# Patient Record
Sex: Male | Born: 1980 | Race: Black or African American | Hispanic: No | Marital: Married | Smoking: Never smoker
Health system: Southern US, Community
[De-identification: ages and names within clinical notes are randomized; demographics above are authoritative.]

## PROBLEM LIST (undated history)

## (undated) DIAGNOSIS — R030 Elevated blood-pressure reading, without diagnosis of hypertension: Secondary | ICD-10-CM

## (undated) DIAGNOSIS — G43909 Migraine, unspecified, not intractable, without status migrainosus: Secondary | ICD-10-CM

## (undated) HISTORY — DX: Migraine, unspecified, not intractable, without status migrainosus: G43.909

## (undated) HISTORY — DX: Elevated blood-pressure reading, without diagnosis of hypertension: R03.0

---

## 2011-04-08 ENCOUNTER — Emergency Department (HOSPITAL_COMMUNITY)
Admission: EM | Admit: 2011-04-08 | Discharge: 2011-04-08 | Disposition: A | Discharge status: Home / Self Care | Payer: Managed Care, Other (non HMO) | Source: Home / Self Care | Attending: Family Medicine | Admitting: Family Medicine

## 2011-04-08 ENCOUNTER — Encounter (HOSPITAL_COMMUNITY): Payer: Self-pay | Admitting: Emergency Medicine

## 2011-04-08 DIAGNOSIS — J029 Acute pharyngitis, unspecified: Secondary | ICD-10-CM

## 2011-04-08 MED ORDER — AMOXICILLIN 500 MG PO CAPS: 500.0000 mg | ORAL_CAPSULE | Freq: Three times a day (TID) | ORAL | Status: AC

## 2011-04-08 NOTE — ED Provider Notes (Signed)
History     CSN: [id number]  Arrival date & time 04/08/11  0808   First MD Initiated Contact with Patient 04/08/11 [phone]      Chief Complaint  Patient presents with  . Sore Throat    (Consider location/radiation/quality/duration/timing/severity/associated sxs/prior treatment) HPI Comments: Jon English presents for evaluation of sore throat since Saturday, chills, myalgias, and low back pain. He denies any cough, sneezing, or rhinorrhea.  Patient is a 31 y.o. male presenting with pharyngitis. The history is provided by the patient.  Sore Throat This is a new problem. The problem occurs constantly. The problem has not changed since onset.The symptoms are aggravated by swallowing. The symptoms are relieved by nothing. He has tried acetaminophen for the symptoms.    History reviewed. No pertinent past medical history.  History reviewed. No pertinent past surgical history.  No family history on file.  History  Substance Use Topics  . Smoking status: Never Smoker   . Smokeless tobacco: Not on file  . Alcohol Use: No      Review of Systems  Constitutional: Negative.   HENT: Positive for sore throat.   Eyes: Negative.   Respiratory: Negative.   Cardiovascular: Negative.   Gastrointestinal: Negative.   Genitourinary: Negative.   Musculoskeletal: Negative.   Skin: Negative.   Neurological: Negative.     Allergies  Review of patient's allergies indicates no known allergies.  Home Medications   Current Outpatient Rx  Name Route Sig Dispense Refill  . AMOXICILLIN 500 MG PO CAPS Oral Take 1 capsule (500 mg total) by mouth 3 (three) times daily. 30 capsule 0    BP 150/85  Pulse 95  Temp(Src) 98.5 F (36.9 C) (Oral)  Resp 14  SpO2 100%  Physical Exam  Nursing note and vitals reviewed. Constitutional: He is oriented to person, place, and time. He appears well-developed and well-nourished.  HENT:  Head: Normocephalic and atraumatic.  Right Ear: Tympanic membrane  normal.  Left Ear: Tympanic membrane normal.  Mouth/Throat: Oropharyngeal exudate and posterior oropharyngeal erythema present.    Eyes: EOM are normal.  Neck: Normal range of motion.  Cardiovascular: Normal rate and regular rhythm.   Pulmonary/Chest: Effort normal and breath sounds normal. He has no decreased breath sounds. He has no wheezes. He has no rhonchi.  Musculoskeletal: Normal range of motion.  Neurological: He is alert and oriented to person, place, and time.  Skin: Skin is warm and dry.  Psychiatric: His behavior is normal.    ED Course  Procedures (including critical care time)   Labs Reviewed  POCT RAPID STREP A ([facility] ONLY)   No results found.   1. Pharyngitis       MDM  2/4 Centor criteria; RST negative; likely viral; given delayed rx for amoxicillin; supportive care        Renaee Munda, MD 04/08/11 [phone]

## 2011-04-08 NOTE — ED Notes (Signed)
PT HERE WITH SORE THROAT RADIATING TO EARS,INCREASED SALIVA AND BODY ACHES THAT STARTED LAST Friday.CHILLS NOTED AND WARM BUT DENIES FEVERS.NO VOMITING OR NECK PAIN REPORTED.VSS

## 2011-04-08 NOTE — Discharge Instructions (Signed)
Your lab results was negative for strep throat. Both viruses and bacteria can cause your symptoms, and can appear very similarly. This is likely viral pharyngitis, given a negative strep screen, and 2/4 Centor criteria. Continue supportive care with ibuprofen (Motrin, Advil) and/or acetaminophen (Tylenol) for fever control and control of pain and inflammation. You may alternate these every 4 hours; for example, if you take acetaminophen at noon, then you can take ibuprofen at 4 pm, then acetaminophen again at 8 pm, etc. Also, encourage aggressive hydration. However, if you are not improving over the next 48 hours, then fill the antibiotic rx. Return to care should your symptoms not improve, or worsen in any way.

## 2014-02-22 ENCOUNTER — Encounter (HOSPITAL_COMMUNITY): Payer: Self-pay | Admitting: *Deleted

## 2014-02-22 ENCOUNTER — Emergency Department (HOSPITAL_COMMUNITY)
Admission: EM | Admit: 2014-02-22 | Discharge: 2014-02-23 | Disposition: A | Discharge status: Home / Self Care | Payer: 59 | Attending: Emergency Medicine | Admitting: Emergency Medicine

## 2014-02-22 ENCOUNTER — Emergency Department (HOSPITAL_COMMUNITY): Payer: 59

## 2014-02-22 DIAGNOSIS — W108XXA Fall (on) (from) other stairs and steps, initial encounter: Secondary | ICD-10-CM | POA: Insufficient documentation

## 2014-02-22 DIAGNOSIS — S0990XA Unspecified injury of head, initial encounter: Secondary | ICD-10-CM | POA: Insufficient documentation

## 2014-02-22 DIAGNOSIS — K029 Dental caries, unspecified: Secondary | ICD-10-CM | POA: Insufficient documentation

## 2014-02-22 DIAGNOSIS — Y998 Other external cause status: Secondary | ICD-10-CM | POA: Insufficient documentation

## 2014-02-22 DIAGNOSIS — Y9389 Activity, other specified: Secondary | ICD-10-CM | POA: Diagnosis not present

## 2014-02-22 DIAGNOSIS — W19XXXA Unspecified fall, initial encounter: Secondary | ICD-10-CM

## 2014-02-22 DIAGNOSIS — K047 Periapical abscess without sinus: Secondary | ICD-10-CM

## 2014-02-22 DIAGNOSIS — S4992XA Unspecified injury of left shoulder and upper arm, initial encounter: Secondary | ICD-10-CM | POA: Diagnosis present

## 2014-02-22 DIAGNOSIS — Y92009 Unspecified place in unspecified non-institutional (private) residence as the place of occurrence of the external cause: Secondary | ICD-10-CM | POA: Insufficient documentation

## 2014-02-22 DIAGNOSIS — Z79899 Other long term (current) drug therapy: Secondary | ICD-10-CM | POA: Diagnosis not present

## 2014-02-22 DIAGNOSIS — M25512 Pain in left shoulder: Secondary | ICD-10-CM

## 2014-02-22 DIAGNOSIS — S199XXA Unspecified injury of neck, initial encounter: Secondary | ICD-10-CM | POA: Diagnosis not present

## 2014-02-22 LAB — COMPREHENSIVE METABOLIC PANEL
ALT: 18 U/L (ref 0–53)
AST: 29 U/L (ref 0–37)
Albumin: 4.1 g/dL (ref 3.5–5.2)
Alkaline Phosphatase: 51 U/L (ref 39–117)
Anion gap: 5 (ref 5–15)
BUN: 11 mg/dL (ref 6–23)
CALCIUM: 9 mg/dL (ref 8.4–10.5)
CO2: 29 mmol/L (ref 19–32)
CREATININE: 1.66 mg/dL — AB (ref 0.50–1.35)
Chloride: 103 mmol/L (ref 96–112)
GFR calc Af Amer: 61 mL/min — ABNORMAL LOW (ref >90)
GFR, EST NON AFRICAN AMERICAN: 52 mL/min — AB (ref >90)
GLUCOSE: 155 mg/dL — AB (ref 70–99)
POTASSIUM: 3.6 mmol/L (ref 3.5–5.1)
SODIUM: 137 mmol/L (ref 135–145)
TOTAL PROTEIN: 6.9 g/dL (ref 6.0–8.3)
Total Bilirubin: 1.7 mg/dL — ABNORMAL HIGH (ref 0.3–1.2)

## 2014-02-22 LAB — CBC WITH DIFFERENTIAL/PLATELET
BASOS ABS: 0 10*3/uL (ref 0.0–0.1)
Basophils Relative: 0 % (ref 0–1)
EOS ABS: 0.1 10*3/uL (ref 0.0–0.7)
Eosinophils Relative: 1 % (ref 0–5)
HCT: 44.2 % (ref 39.0–52.0)
Hemoglobin: 14.9 g/dL (ref 13.0–17.0)
LYMPHS PCT: 25 % (ref 12–46)
Lymphs Abs: 1.9 10*3/uL (ref 0.7–4.0)
MCH: 30.4 pg (ref 26.0–34.0)
MCHC: 33.7 g/dL (ref 30.0–36.0)
MCV: 90.2 fL (ref 78.0–100.0)
Monocytes Absolute: 0.7 10*3/uL (ref 0.1–1.0)
Monocytes Relative: 9 % (ref 3–12)
Neutro Abs: 5.1 10*3/uL (ref 1.7–7.7)
Neutrophils Relative %: 65 % (ref 43–77)
PLATELETS: 197 10*3/uL (ref 150–400)
RBC: 4.9 MIL/uL (ref 4.22–5.81)
RDW: 13.2 % (ref 11.5–15.5)
WBC: 7.8 10*3/uL (ref 4.0–10.5)

## 2014-02-22 MED ORDER — SODIUM CHLORIDE 0.9 % IV SOLN
3.0000 g | Freq: Once | INTRAVENOUS | Status: AC
  Administered 2014-02-22: 3 g via INTRAVENOUS
  Filled 2014-02-22: qty 3

## 2014-02-22 MED ORDER — SODIUM CHLORIDE 0.9 % IV BOLUS (SEPSIS)
2000.0000 mL | Freq: Once | INTRAVENOUS | Status: AC
  Administered 2014-02-22: 2000 mL via INTRAVENOUS

## 2014-02-22 NOTE — ED Notes (Signed)
The pt has had a headache for 2 days and a toothache also.  No pain in those areas noiw.  He took a vicodin and fell down steps at home.  No loc he has body aches now and lac  Forehead.

## 2014-02-23 MED ORDER — PENICILLIN V POTASSIUM 500 MG PO TABS: 500.0000 mg | ORAL_TABLET | Freq: Three times a day (TID) | ORAL | Status: DC

## 2014-02-23 MED ORDER — HYDROCODONE-ACETAMINOPHEN 5-325 MG PO TABS: 1.0000 | ORAL_TABLET | ORAL | Status: DC | PRN

## 2014-02-23 NOTE — ED Provider Notes (Signed)
CSN: [id number]     Arrival date & time 02/22/14  2032 History   First MD Initiated Contact with Patient 02/22/14 2057     Chief Complaint  Patient presents with  . Fall     HPI Patient reports his had left lower dental pain over the past 2-3 days.  He had decreased oral intake and has not been eating much secondary to dental pain.  He has not seen a dentist about this.  He does admit to having dental decay at baseline.  He took Vicodin that he had at the house for pain and became lightheaded when he was standing at the top of the stairs.  He has syncopal episode resulting in him falling down the stairs.  He reports that he fell down the majority of the stairs.  He reports pain in his anterior left shoulder at this time.  He reports mild headache injury.  Reports mild neck pain.  He denies weakness of his arms or legs.  Denies chest pain or abdominal pain.  No back pain.  No pain in his legs.  His dental pain is mild to moderate in severity.  No difficulty breathing or swallowing.   History reviewed. No pertinent past medical history. History reviewed. No pertinent past surgical history. No family history on file. History  Substance Use Topics  . Smoking status: Never Smoker   . Smokeless tobacco: Not on file  . Alcohol Use: No    Review of Systems  All other systems reviewed and are negative.     Allergies  Review of patient's allergies indicates no known allergies.  Home Medications   Prior to Admission medications   Medication Sig Start Date End Date Taking? Authorizing Provider  Pseudoeph-Doxylamine-DM-APAP (NYQUIL PO) Take 15 mLs by mouth at bedtime as needed (cold symptoms).   Yes Historical Provider, MD  HYDROcodone-acetaminophen (NORCO/VICODIN) 5-325 MG per tablet Take 1 tablet by mouth every 4 (four) hours as needed for moderate pain. 02/23/14   Lyanne Co, MD  penicillin v potassium (VEETID) 500 MG tablet Take 1 tablet (500 mg total) by mouth 3 (three) times daily.  02/23/14   Lyanne Co, MD   BP 132/82 mmHg  Pulse 81  Temp(Src) 98.1 F (36.7 C) (Oral)  Resp 16  SpO2 100% Physical Exam  Constitutional: He is oriented to person, place, and time. He appears well-developed and well-nourished.  HENT:  Head: Normocephalic and atraumatic.  Dry mucous membranes.  Dental decay throughout.  Left lower second molar has evidence of dental decay and focal tenderness  Eyes: EOM are normal.  Neck: Neck supple.  C-spine immobilized in cervical collar.  Mild cervical paracervical tenderness without cervical step-offs.  Cardiovascular: Normal rate, regular rhythm, normal heart sounds and intact distal pulses.   Pulmonary/Chest: Effort normal and breath sounds normal. No respiratory distress.  Abdominal: Soft. He exhibits no distension. There is no tenderness.  Musculoskeletal: Normal range of motion.  Full range of motion bilateral hips, knees, ankles.  Pain with range of motion of the left shoulder. no obvious deformity of the left before meals joint.  Mild tenderness at the anterior left humeral head without deformity.  Normal left radial pulse..  Normal range of motion of his bilateral elbows and wrists.  Normal range of motion of his right shoulder.  No thoracic or lumbar tenderness.  Neurological: He is alert and oriented to person, place, and time.  Skin: Skin is warm and dry.  Psychiatric: He has a normal  mood and affect. Judgment normal.  Nursing note and vitals reviewed.   ED Course  Procedures (including critical care time) Labs Review Labs Reviewed  COMPREHENSIVE METABOLIC PANEL - Abnormal; Notable for the following:    Glucose, Bld 155 (*)    Creatinine, Ser 1.66 (*)    Total Bilirubin 1.7 (*)    GFR calc non Af Amer 52 (*)    GFR calc Af Amer 61 (*)    All other components within normal limits  CBC WITH DIFFERENTIAL/PLATELET    Imaging Review Dg Chest 1 View  02/22/2014   CLINICAL DATA:  Cough, weakness, and dizziness. Fell down 15 steps  today. Pain left-sided neck and shoulder.  EXAM: CHEST  1 VIEW  COMPARISON:  None.  FINDINGS: The heart size and mediastinal contours are within normal limits. Both lungs are clear. The visualized skeletal structures are unremarkable.  IMPRESSION: No active disease.   Electronically Signed   By: Burman Nieves M.D.   On: 02/22/2014 21:52   Dg Cervical Spine Complete  02/22/2014   CLINICAL DATA:  sick with a cough but got very weak and dizzy and fell down 15 steps todayPain lt side of neck and shoulder  EXAM: CERVICAL SPINE  4+ VIEWS  COMPARISON:  None.  FINDINGS: There is no evidence of cervical spine fracture or prevertebral soft tissue swelling. Alignment is normal. No other significant bone abnormalities are identified.  IMPRESSION: Negative cervical spine radiographs.   Electronically Signed   By: Corlis Leak M.D.   On: 02/22/2014 21:53   Ct Head Wo Contrast  02/22/2014   CLINICAL DATA:  Patient passed out 06 p.m. this evening in struck floor. Headache and hematoma to the right side of the forehead.  EXAM: CT HEAD WITHOUT CONTRAST  TECHNIQUE: Contiguous axial images were obtained from the base of the skull through the vertex without intravenous contrast.  COMPARISON:  None.  FINDINGS: Ventricles and sulci appear symmetrical. No mass effect or midline shift. No abnormal extra-axial fluid collections. Gray-white matter junctions are distinct. Basal cisterns are not effaced. No evidence of acute intracranial hemorrhage. No depressed skull fractures. Visualized paranasal sinuses and mastoid air cells are not opacified.  IMPRESSION: No acute intracranial abnormalities.  Normal examination.   Electronically Signed   By: Burman Nieves M.D.   On: 02/22/2014 22:13   Dg Shoulder Left  02/22/2014   CLINICAL DATA:  sick with a cough but got very weak and dizzy and fell down 15 steps todayPain lt side of neck and shoulder  EXAM: LEFT SHOULDER - 2+ VIEW  COMPARISON:  None.  FINDINGS: There is no evidence of fracture  or dislocation. There is no evidence of arthropathy or other focal bone abnormality. Soft tissues are unremarkable.  IMPRESSION: Negative.   Electronically Signed   By: Corlis Leak M.D.   On: 02/22/2014 21:53  I personally reviewed the imaging tests through PACS system I reviewed available ER/hospitalization records through the EMR    EKG Interpretation   Date/Time:  Tuesday February 22 2014 20:42:32 EST Ventricular Rate:  63 PR Interval:  146 QRS Duration: 82 QT Interval:  396 QTC Calculation: 405 R Axis:   84 Text Interpretation:  Normal sinus rhythm Nonspecific T wave abnormality  Abnormal ECG No old tracing to compare Confirmed by [facility]  MD, TREY  ([id number]) on 02/22/2014 8:47:19 PM      MDM   Final diagnoses:  Fall, initial encounter  Left shoulder pain  Dental infection  Patient is overall well-appearing.  Head CT and CT C-spine negative for acute injury.  Left shoulder and chest x-ray are normal.  Abdominal exam is benign.  Initial blood pressure was in the 70s systolic.  Suspect that he's been involving depleted and took a Vicodin on top of that as he reports that he did feel lightheaded prior to his syncopal episode at the top of the stairs.  I do not believe his hypotension is secondary to the trauma itself.  Doubt intra-abdominal hemorrhage.  Repeat abdominal exam is benign.  Blood pressure quickly responded to IV fluids.  Hemoglobin normal.  Patient is an blade around the emergency department after 2 L of IV fluids and states he feels much better.  He was given IV Unasyn.  He's been told follow-up with the dentist.  He'll be discharged home on penicillin.  He understands to return to the ER for new or worsening symptoms.    Lyanne CoKevin M Arliene Rosenow, MD 02/23/14 [phone]

## 2014-02-23 NOTE — ED Notes (Signed)
Pt ambulatory with steady gait. Pt denied dizziness. This RN removed c-collar with permission from Croftonampos, MD.

## 2016-01-26 ENCOUNTER — Ambulatory Visit (INDEPENDENT_AMBULATORY_CARE_PROVIDER_SITE_OTHER)
Admission: RE | Admit: 2016-01-26 | Discharge: 2016-01-26 | Disposition: A | Discharge status: Home / Self Care | Payer: 59 | Source: Ambulatory Visit | Attending: Primary Care | Admitting: Primary Care

## 2016-01-26 ENCOUNTER — Ambulatory Visit (INDEPENDENT_AMBULATORY_CARE_PROVIDER_SITE_OTHER): Payer: 59 | Admitting: Primary Care

## 2016-01-26 ENCOUNTER — Encounter (INDEPENDENT_AMBULATORY_CARE_PROVIDER_SITE_OTHER): Payer: Self-pay

## 2016-01-26 ENCOUNTER — Encounter: Payer: Self-pay | Admitting: Primary Care

## 2016-01-26 VITALS — BP 148/94 | HR 87 | Temp 98.4°F | Ht 70.0 in | Wt 208.4 lb

## 2016-01-26 DIAGNOSIS — M545 Low back pain, unspecified: Secondary | ICD-10-CM

## 2016-01-26 DIAGNOSIS — R030 Elevated blood-pressure reading, without diagnosis of hypertension: Secondary | ICD-10-CM | POA: Diagnosis not present

## 2016-01-26 NOTE — Progress Notes (Signed)
Subjective:    Patient ID: Jon English, male    DOB: [birth year], 36 y.o.   MRN: [id number]  HPI  Jon English is a 36 year old male who presents today to establish care and discuss the problems mentioned below. Will obtain old records.  1) Back Pain: Located to left lower back that has been present for the past 2 weeks. He's not taking anything OTC for his symptoms. His back pain is worse when riding in the car, sitting on hard surfaces, and bending forward. He works out at [employer] 5 days weekly without pain. He denies numbness/tingling, radiation of pain to his extremity, recent trauma. He describes his pain as a pulling sensation.   2) Wrist Pain: Located to left wrist. Pain has been present intermittently for the past 1 year. Overall his pain is better, but will become bothersome on occasion.   3) Elevated Blood Pressure: History of high blood pressure readings when he was sick. Blood pressure in the office today of 148/94. He's not recently checked his BP. He has a family history of hypertension on both sides. He denies chest pain, dizziness, fatigue, visual changes.  Review of Systems  Eyes: Negative for visual disturbance.  Respiratory: Negative for shortness of breath.   Cardiovascular: Negative for chest pain.  Genitourinary: Negative for dysuria and frequency.  Musculoskeletal: Positive for back pain.  Neurological: Negative for dizziness, numbness and headaches.       Past Medical History:  Diagnosis Date  . Elevated blood pressure reading   . Migraines      Social History   Social History  . Marital status: Married    Spouse name: N/A  . Number of children: N/A  . Years of education: N/A   Occupational History  . Not on file.   Social History Main Topics  . Smoking status: Never Smoker  . Smokeless tobacco: Not on file  . Alcohol use No  . Drug use: No  . Sexual activity: Not on file   Other Topics Concern  . Not on file   Social History  Narrative   Married.   2 children.   Unemployed.   Enjoys exercising.    No past surgical history on file.  Family History  Problem Relation Age of Onset  . Alcohol abuse Mother   . Alcohol abuse Father   . Diabetes Maternal Grandmother   . Hypertension Maternal Grandmother   . Diabetes Paternal Grandmother   . Hypertension Paternal Grandmother     No Known Allergies  No current outpatient prescriptions on file prior to visit.   No current facility-administered medications on file prior to visit.     BP (!) 148/94   Pulse 87   Temp 98.4 F (36.9 C) (Oral)   Ht 5\' 10"  (1.778 m)   Wt 208 lb 6.4 oz (94.5 kg)   SpO2 98%   BMI 29.90 kg/m    Objective:   Physical Exam  Constitutional: He appears well-nourished.  Neck: Neck supple.  Cardiovascular: Normal rate and regular rhythm.   Pulmonary/Chest: Effort normal and breath sounds normal.  Abdominal: There is no CVA tenderness.  Musculoskeletal:       Lumbar back: He exhibits pain. He exhibits normal range of motion, no tenderness, no bony tenderness and no spasm.  Skin: Skin is warm and dry.          Assessment & Plan:  Acute Back Pain:  Present for the past 2 weeks.  Works out  daily at the gym, no pain when exercising. Could be arthritis or muscle spasm. Xray of lumbar spine pending. Will have him trial Aleve for 1 week, report if no improvement.  [name],[name], NP

## 2016-01-26 NOTE — Assessment & Plan Note (Signed)
Above goal in office today, even on recheck. He works out daily and endorses a healthy diet. Discussed to monitor and report readings at or above 140/90.

## 2016-01-26 NOTE — Progress Notes (Signed)
Pre visit review using our clinic review tool, if applicable. No additional management support is needed unless otherwise documented below in the visit note. 

## 2016-01-26 NOTE — Patient Instructions (Signed)
Complete xray(s) prior to leaving today. I will notify you of your results once received.  Try Naproxen tablets daily for the next 5 days. This may be purchased over the counter.   Your blood pressure is too high, it should be less than 140/90. Monitor your blood pressure and notify me of readings at or above that number on a consistent basis.  It was a pleasure to meet you today! Please don't hesitate to call me with any questions. Welcome to [facility]!

## 2016-01-29 ENCOUNTER — Other Ambulatory Visit: Payer: Self-pay | Admitting: Primary Care

## 2016-01-29 DIAGNOSIS — M545 Low back pain, unspecified: Secondary | ICD-10-CM

## 2016-01-29 MED ORDER — METHOCARBAMOL 500 MG PO TABS: 500.0000 mg | ORAL_TABLET | Freq: Three times a day (TID) | ORAL | 0 refills | Status: DC | PRN

## 2016-02-01 ENCOUNTER — Encounter: Payer: Self-pay | Admitting: Primary Care

## 2016-02-02 ENCOUNTER — Other Ambulatory Visit: Payer: Self-pay | Admitting: Primary Care

## 2016-02-02 DIAGNOSIS — M545 Low back pain, unspecified: Secondary | ICD-10-CM

## 2016-02-02 MED ORDER — PREDNISONE 20 MG PO TABS: ORAL_TABLET | ORAL | 0 refills | Status: DC

## 2016-02-09 ENCOUNTER — Other Ambulatory Visit: Payer: Self-pay | Admitting: Primary Care

## 2016-02-09 DIAGNOSIS — M545 Low back pain, unspecified: Secondary | ICD-10-CM

## 2016-02-09 MED ORDER — DICLOFENAC SODIUM 75 MG PO TBEC: 75.0000 mg | DELAYED_RELEASE_TABLET | Freq: Two times a day (BID) | ORAL | 0 refills | Status: DC | PRN

## 2016-02-15 ENCOUNTER — Encounter: Payer: Self-pay | Admitting: Family Medicine

## 2016-02-15 ENCOUNTER — Ambulatory Visit (INDEPENDENT_AMBULATORY_CARE_PROVIDER_SITE_OTHER): Payer: 59 | Admitting: Family Medicine

## 2016-02-15 VITALS — BP 120/78 | HR 66 | Temp 98.2°F | Ht 70.0 in | Wt 206.0 lb

## 2016-02-15 DIAGNOSIS — G5702 Lesion of sciatic nerve, left lower limb: Secondary | ICD-10-CM

## 2016-02-15 DIAGNOSIS — M67952 Unspecified disorder of synovium and tendon, left thigh: Secondary | ICD-10-CM

## 2016-02-15 DIAGNOSIS — M5432 Sciatica, left side: Secondary | ICD-10-CM

## 2016-02-15 NOTE — Progress Notes (Signed)
Pre visit review using our clinic review tool, if applicable. No additional management support is needed unless otherwise documented below in the visit note. 

## 2016-02-15 NOTE — Progress Notes (Signed)
Dr. Karleen HampshireSpencer T. Tyaisha Cullom, MD, CAQ Sports Medicine [facility] [address] [place], [id number] Phone: [phone] Fax: [phone]  02/15/2016  Patient: Jon PangChristopher English, MRN: [id number], DOB: 1980-07-04, 36 y.o.  Primary Physician:  [name],Katherine Kendal, NP   Chief Complaint  Patient presents with  . Sciatica   Subjective:   Jon English is a 36 y.o. very pleasant male patient who presents with the following:  New sciatica:   Started out in the L low back and when sitting in a constant position and this is been ongoing for approximately 1-2 months., will hurt really bad and get really painful. Primarily this comes on when he is in a seated position and is in his left buttocks area.  He occasionally will have some pain down the posterior of his thigh.  He denies any bowel or bladder incontinence, numbness, or weakness.  Has used some alleve and muscle relaxers. He also has taken some oral diclofenac.  Sitting sometimes will make it hurt a lot.  Driving will hurt a lot.   Out of work.  Beinding in a forward flexion position will sometimes hurt.  Past Medical History, Surgical History, Social History, Family History, Problem List, Medications, and Allergies have been reviewed and updated if relevant.  Patient Active Problem List   Diagnosis Date Noted  . Elevated blood pressure reading 01/26/2016    Past Medical History:  Diagnosis Date  . Elevated blood pressure reading   . Migraines     No past surgical history on file.  Social History   Social History  . Marital status: Married    Spouse name: N/A  . Number of children: N/A  . Years of education: N/A   Occupational History  . Not on file.   Social History Main Topics  . Smoking status: Never Smoker  . Smokeless tobacco: Never Used  . Alcohol use No  . Drug use: No  . Sexual activity: Not on file   Other Topics Concern  . Not on file   Social History  Narrative   Married.   2 children.   Unemployed.   Enjoys exercising.    Family History  Problem Relation Age of Onset  . Alcohol abuse Mother   . Alcohol abuse Father   . Diabetes Maternal Grandmother   . Hypertension Maternal Grandmother   . Diabetes Paternal Grandmother   . Hypertension Paternal Grandmother     No Known Allergies  Medication list reviewed and updated in full in [facility].  GEN: No fevers, chills. Nontoxic. Primarily MSK c/o today. MSK: Detailed in the HPI GI: tolerating PO intake without difficulty Neuro: No numbness, parasthesias, or tingling associated. Otherwise the pertinent positives of the ROS are noted above.   Objective:   BP 120/78   Pulse 66   Temp 98.2 F (36.8 C) (Oral)   Ht 5\' 10"  (1.778 m)   Wt 206 lb (93.4 kg)   BMI 29.56 kg/m    GEN: Well-developed,well-nourished,in no acute distress; alert,appropriate and cooperative throughout examination HEENT: Normocephalic and atraumatic without obvious abnormalities. Ears, externally no deformities PULM: Breathing comfortably in no respiratory distress EXT: No clubbing, cyanosis, or edema PSYCH: Normally interactive. Cooperative during the interview. Pleasant. Friendly and conversant. Not anxious or depressed appearing. Normal, full affect.  Range of motion at  the waist: Flexion: normal Extension: normal Lateral bending: normal Rotation: all normal  No echymosis or edema Rises to examination table with no difficulty Gait: non  antalgic  Inspection/Deformity: N Paraspinus Tenderness: minimal  B Ankle Dorsiflexion (L5,4): 5/5 B Great Toe Dorsiflexion (L5,4): 5/5 Heel Walk (L5): WNL Toe Walk (S1): WNL Rise/Squat (L4): WNL  SENSORY B Medial Foot (L4): WNL B Dorsum (L5): WNL B Lateral (S1): WNL Light Touch: WNL Pinprick: WNL  REFLEXES Knee (L4): 2+ Ankle (S1): 2+  B SLR, seated: neg B SLR, supine: neg B FABER: neg B Reverse FABER: POS AND TTP AT PIRIFORMIS ON THE  LEFT B Greater Troch: NT B Log Roll: neg B Stork: NT B Sciatic Notch: NT   HIP EXAM: SIDE: L ROM: Abduction, Flexion, Internal and External range of motion: full, a relatively limited motion that would be expected for age. Pain with terminal IROM and EROM: mild GTB: NT SLR: NEG Knees: No effusion FABER: NT REVERSE FABER: POS Piriformis: TTP Str: flexion: 4+/5 abduction: 4--/5 adduction: 4+/5   Radiology: Dg Lumbar Spine Complete  Result Date: 01/26/2016 CLINICAL DATA:  left lower back pain. No injury, rule out abnormality to lumbar spine. EXAM: LUMBAR SPINE - COMPLETE 4+ VIEW COMPARISON:  None. FINDINGS: There is no evidence of lumbar spine fracture. Alignment is normal. Intervertebral disc spaces are maintained. IMPRESSION: Negative. Electronically Signed   By: Amie Portland M.D.   On: 01/26/2016 14:57    Assessment and Plan:   Sciatica, left side  Piriformis syndrome of left side  Tendinopathy of left gluteus medius   Fairly classic history and exam.  Sciatica to the left buttocks, most likely Piriformis in character.  Worsened with driving and sitting.  Focal examination and very weak hip stabilizers with very poor hip range of motion for age.  Initially I was going to have him do classic hip rehabilitation, and most people do well with this only.  The patient called the next day and noted that he was in significant pain.  I sent him in a small number of Tylenol No. 3 as well as a prednisone course. No recent prescriptions filled on the controlled substances database in [place].  Most strong young people do very well in similar circumstances and do not need follow-up, but if he fails to improve I recommended follow-up in 4-5 weeks.  Meds ordered this encounter  Medications  . predniSONE (DELTASONE) 20 MG tablet    Sig: 2 tabs po for 5 days, then 1 tab po for 5 days    Dispense:  15 tablet    Refill:  0  . acetaminophen-codeine (TYLENOL #3) 300-30 MG tablet     Sig: Take 1 tablet by mouth every 8 (eight) hours as needed for moderate pain.    Dispense:  20 tablet    Refill:  0    Signed,  Jonatan Wilsey T. Myleka Moncure, MD   Allergies as of 02/15/2016   No Known Allergies     Medication List       Accurate as of 02/15/16 11:59 PM. Always use your most recent med list.          acetaminophen-codeine 300-30 MG tablet Commonly known as:  TYLENOL #3 Take 1 tablet by mouth every 8 (eight) hours as needed for moderate pain.   diclofenac 75 MG EC tablet Commonly known as:  VOLTAREN Take 1 tablet (75 mg total) by mouth 2 (two) times daily as needed for moderate pain.   methocarbamol 500 MG tablet Commonly known as:  ROBAXIN Take 1 tablet (500 mg total) by mouth every 8 (eight) hours as needed for muscle spasms.   predniSONE 20  MG tablet Commonly known as:  DELTASONE 2 tabs po for 5 days, then 1 tab po for 5 days

## 2016-02-16 MED ORDER — PREDNISONE 20 MG PO TABS: ORAL_TABLET | ORAL | 0 refills | Status: DC

## 2016-02-16 MED ORDER — ACETAMINOPHEN-CODEINE #3 300-30 MG PO TABS: 1.0000 | ORAL_TABLET | Freq: Three times a day (TID) | ORAL | 0 refills | Status: DC | PRN

## 2016-02-16 NOTE — Telephone Encounter (Signed)
Hi Spencer, see My Chart message regarding this patient you saw yesterday.

## 2016-02-18 NOTE — Telephone Encounter (Signed)
Hi Jon English, see My Chart message that was sent to me.

## 2016-02-19 NOTE — Telephone Encounter (Signed)
Tylenol #3 called into [facility] on [address]. 

## 2016-02-20 ENCOUNTER — Encounter: Payer: Self-pay | Admitting: Family Medicine

## 2016-02-20 NOTE — Telephone Encounter (Signed)
I called this in per my prior note. I suspect that there was a time delay in getting voicemail message for tylenol #3 vs the electronically prescribed prednisone.

## 2016-03-12 ENCOUNTER — Encounter: Payer: Self-pay | Admitting: Family Medicine

## 2016-03-13 ENCOUNTER — Other Ambulatory Visit: Payer: Self-pay | Admitting: Primary Care

## 2016-03-13 DIAGNOSIS — M545 Low back pain, unspecified: Secondary | ICD-10-CM

## 2016-03-13 MED ORDER — DICLOFENAC SODIUM 75 MG PO TBEC
75.0000 mg | DELAYED_RELEASE_TABLET | Freq: Two times a day (BID) | ORAL | 0 refills | Status: DC | PRN
Start: 2016-03-13 — End: 2018-12-01

## 2016-03-13 MED ORDER — METHOCARBAMOL 500 MG PO TABS: 500.0000 mg | ORAL_TABLET | Freq: Three times a day (TID) | ORAL | 0 refills | Status: DC | PRN

## 2016-03-27 ENCOUNTER — Encounter: Payer: Self-pay | Admitting: Primary Care

## 2016-04-05 ENCOUNTER — Telehealth: Payer: Self-pay | Admitting: Primary Care

## 2016-04-05 ENCOUNTER — Encounter: Payer: Self-pay | Admitting: Primary Care

## 2016-04-05 NOTE — Telephone Encounter (Signed)
Noted  

## 2016-04-05 NOTE — Telephone Encounter (Signed)
Going to [facility].

## 2016-04-05 NOTE — Telephone Encounter (Signed)
[facility] Client TELEPHONE ADVICE RECORD [facility] Patient Name: Voncille LoCHRIS Rubert DOB: 06/05/1980 Initial Comment Caller states sciatica problems were better and now worse; anything can do for pain until can be seen tomorrow? initially left lower back; saw MD, Therapy, last week pain back but has moved to left leg; more painful when starting to walk; Nurse Assessment Nurse: Debera Latalston, RN, Tinnie GensJeffrey Date/Time (Eastern Time): 04/05/2016 2:33:10 PM Confirm and document reason for call. If symptomatic, describe symptoms. ---Caller states sciatica problems were better and now worse; anything can do for pain until can be seen tomorrow? initially left lower back; saw MD, Therapy, last week pain back but has moved to left leg; more painful when starting to walk. On baclofen. Having issues since February. Pain in leg started this week. Does the patient have any new or worsening symptoms? ---Yes Will a triage be completed? ---Yes Related visit to physician within the last 2 weeks? ---No Does the PT have any chronic conditions? (i.e. diabetes, asthma, etc.) ---No Is this a behavioral health or substance abuse call? ---No Guidelines Guideline Title Affirmed Question Affirmed Notes Back Pain [1] Pain radiates into the thigh or further down the leg AND [2] one leg Final Disposition User See PCP When Office is Open (within 3 days) Debera Latalston, RN, [employer] CorporationJeffrey Comments Caller reports that he has appointment already scheduled for 10:00 a.m. with [facility] for tomorrow. Referrals REFERRED TO PCP OFFICE Disagree/Comply: Comply

## 2016-04-06 ENCOUNTER — Ambulatory Visit (INDEPENDENT_AMBULATORY_CARE_PROVIDER_SITE_OTHER): Payer: 59 | Admitting: Family Medicine

## 2016-04-06 VITALS — BP 152/100 | HR 72 | Temp 98.0°F | Resp 16 | Wt 209.0 lb

## 2016-04-06 DIAGNOSIS — M5416 Radiculopathy, lumbar region: Secondary | ICD-10-CM | POA: Diagnosis not present

## 2016-04-06 MED ORDER — PREDNISONE 10 MG PO TABS: ORAL_TABLET | ORAL | 0 refills | Status: DC

## 2016-04-06 NOTE — Progress Notes (Signed)
Subjective:     Patient ID: Jon PangChristopher Wendorf, male   DOB: Apr 08, 1980, 36 y.o.   MRN: [id number]  HPI Patient seen in Saturday clinic with persistent left lumbar back pain with radiation down the left anterior thigh toward the knee. Onset in January. Denies specific injury. He does lots of weight lifting. His pain is worse with change of position and somewhat improved with standing and walking. He describes a sharp pain. Denies any numbness or weakness. No loss of urine or stool control. He's tried Diclofenac and Robaxin without improvement.   Still exercising 5 days per week. He had physical therapy couple times but did not see any improvement. He's tried extension stretches without improvement. Denies any past history of back difficulties.  Past Medical History:  Diagnosis Date  . Elevated blood pressure reading   . Migraines    No past surgical history on file.  reports that he has never smoked. He has never used smokeless tobacco. He reports that he does not drink alcohol or use drugs. family history includes Alcohol abuse in his father and mother; Diabetes in his maternal grandmother and paternal grandmother; Hypertension in his maternal grandmother and paternal grandmother. No Known Allergies   Review of Systems  Constitutional: Negative for activity change, appetite change and fever.  Respiratory: Negative for cough and shortness of breath.   Cardiovascular: Negative for chest pain and leg swelling.  Gastrointestinal: Negative for abdominal pain and vomiting.  Genitourinary: Negative for dysuria, flank pain and hematuria.  Musculoskeletal: Positive for back pain. Negative for joint swelling.  Neurological: Negative for weakness and numbness.       Objective:   Physical Exam  Constitutional: He is oriented to person, place, and time. He appears well-developed and well-nourished. No distress.  Neck: No thyromegaly present.  Cardiovascular: Normal rate, regular rhythm and  normal heart sounds.   No murmur heard. Pulmonary/Chest: Effort normal and breath sounds normal. No respiratory distress. He has no wheezes. He has no rales.  Musculoskeletal: He exhibits no edema.  SLR- mild pain LLE.  Good ROM lumbar spine.  No spinal tenderness.    Neurological: He is alert and oriented to person, place, and time. He has normal reflexes. No cranial nerve deficit.  Full strength lower extremities.    Skin: No rash noted.       Assessment:     Persistent left lumbar radiculitis symptoms with non-focal neuro exam. Not improving thus far with PT or medications.    Plan:     -recommend trial of prednisone taper and reviewed potential side effects -Continue back extension stretches -May need to consider MRI lumbosacral spine if symptoms persist -We have asked that he contact his primary provider next week if symptoms not improving.  Kristian CoveyBruce W Preet Perrier MD [facility]

## 2016-04-06 NOTE — Patient Instructions (Signed)
Radicular Pain Radicular pain is a type of pain that spreads from your back or neck along a spinal nerve. Spinal nerves are nerves that leave the spinal cord and go to the muscles. Radicular pain occurs when one of these nerves becomes irritated or squeezed (compressed). Radicular pain is sometimes called radiculopathy, radiculitis, or a pinched nerve. When you have this type of pain, you may also have weakness, numbness, or tingling in the area of your body that is supplied by the nerve. The pain may feel sharp and burning. Spinal nerves leave the spinal cord through openings between the 24 bones (vertebrae) that make up the spine. Radicular pain is often caused by something pushing on a spinal nerve. This pushing may be done by a vertebra or by one of the round cushions between vertebrae (intervertebral disks). This can result from an injury, from wear and tear or aging of a disk, or from the growth of a bone spur that pushes on the nerve. Radicular pain can occur in various areas depending on which spinal nerve is affected:  Cervical radicular pain occurs in the neck. You may also feel pain, numbness, weakness, or tingling in the arms.  Thoracic radicular pain occurs in the mid-spine area. You would feel this pain in the back and chest. This type is rare.  Lumbar radicular pain occurs in the lower back area. You would feel this pain as low back pain. You may feel pain, numbness, weakness, or tingling in the buttocks or legs. Sciatica is a type of lumbar radicular pain that shoots down the back of the leg. Radicular pain often goes away when you follow instructions from your health care provider for relieving pain at home. Follow these instructions at home: Managing pain   If directed, apply ice to the affected area:  Put ice in a plastic bag.  Place a towel between your skin and the bag.  Leave the ice on for 20 minutes, 2-3 times a day.  If directed, apply heat to the affected area as often  as told by your health care provider. Use the heat source that your health care provider recommends, such as a moist heat pack or a heating pad.  Place a towel between your skin and the heat source.  Leave the heat on for 20-30 minutes.  Remove the heat if your skin turns bright red. This is especially important if you are unable to feel pain, heat, or cold. You may have a greater risk of getting burned. Activity    Do not sit or rest in bed for long periods of time.  Try to stay as active as possible. Ask your health care provider what type of exercise or activity is best for you.  Avoid activities that make your pain worse, such as bending and lifting.  Do not lift anything that is heavier than 10 lb (4.5 kg). Practice using proper technique when lifting items. Proper lifting technique involves bending your knees and rising up.  Do strength and range-of-motion exercises only as told by your health care provider. General instructions   Take over-the-counter and prescription medicines only as told by your health care provider.  Pay attention to any changes in your symptoms.  Keep all follow-up visits as told by your health care provider. This is important. Contact a health care provider if:  Your pain and other symptoms get worse.  Your pain medicine is not helping.  Your pain has not improved after a few weeks of home   care.  You have a fever. Get help right away if:  You have severe pain, weakness, or numbness.  You have difficulty with bladder or bowel control. This information is not intended to replace advice given to you by your health care provider. Make sure you discuss any questions you have with your health care provider. Document Released: 02/08/2004 Document Revised: 06/08/2015 Document Reviewed: 07/27/2014 Elsevier Interactive Patient Education  2017 Elsevier Inc.  

## 2016-04-06 NOTE — Progress Notes (Signed)
Pre visit review using our clinic review tool, if applicable. No additional management support is needed unless otherwise documented below in the visit note. 

## 2016-04-08 ENCOUNTER — Ambulatory Visit: Payer: 59 | Admitting: Family Medicine

## 2016-04-10 ENCOUNTER — Ambulatory Visit (INDEPENDENT_AMBULATORY_CARE_PROVIDER_SITE_OTHER): Payer: 59 | Admitting: Family Medicine

## 2016-04-10 ENCOUNTER — Encounter: Payer: Self-pay | Admitting: Family Medicine

## 2016-04-10 VITALS — BP 140/90 | HR 76 | Temp 98.2°F | Ht 70.0 in | Wt 210.5 lb

## 2016-04-10 DIAGNOSIS — M5432 Sciatica, left side: Secondary | ICD-10-CM | POA: Diagnosis not present

## 2016-04-10 MED ORDER — PREDNISONE 20 MG PO TABS: ORAL_TABLET | ORAL | 0 refills | Status: DC

## 2016-04-10 NOTE — Progress Notes (Signed)
Dr. Karleen Hampshire T. Abbe Bula, MD, CAQ [facility] [address] [place], [address] Phone: [phone] Fax: [phone]  04/10/2016  Patient: Jon English, MRN: [id number], DOB: Nov 29, 1980, 36 y.o.  Primary Physician:  Morrie Sheldon, NP   Chief Complaint  Patient presents with  . Sciatica    Seen 02/15/16-Still having pain   Subjective:   Jon English is a 36 y.o. very pleasant male patient who presents with the following:  f/u ongoing L radiculopathy, failure all conservative measures thus far.   Was feeling a little better, then went to some PT. Did some PT, then on the L side in the thigh. Felt like a tight pull in his leg even with standing.   Then Dr. Caryl Never gave him some prednisone again.   Throws car shows. Sunday woke up and felt pretty good, and some pain on Monday.   Took some Midol and that has helped.   Today, he is actually completely asymptomatic and feels very good.  Previously even for 5 days ago he was in extreme pain and having radicular pain going down his left leg.  02/15/2016 Last OV with Hannah Beat, MD  New sciatica:   Started out in the L low back and when sitting in a constant position and this is been ongoing for approximately 1-2 months., will hurt really bad and get really painful. Primarily this comes on when he is in a seated position and is in his left buttocks area.  He occasionally will have some pain down the posterior of his thigh.  He denies any bowel or bladder incontinence, numbness, or weakness.  Has used some alleve and muscle relaxers. He also has taken some oral diclofenac.  Sitting sometimes will make it hurt a lot.  Driving will hurt a lot.   Out of work.  Beinding in a forward flexion position will sometimes hurt.  Past Medical History, Surgical History, Social History, Family History, Problem List, Medications, and Allergies have been reviewed and updated  if relevant.  Patient Active Problem List   Diagnosis Date Noted  . Elevated blood pressure reading 01/26/2016    Past Medical History:  Diagnosis Date  . Elevated blood pressure reading   . Migraines     No past surgical history on file.  Social History   Social History  . Marital status: Married    Spouse name: N/A  . Number of children: N/A  . Years of education: N/A   Occupational History  . Not on file.   Social History Main Topics  . Smoking status: Never Smoker  . Smokeless tobacco: Never Used  . Alcohol use No  . Drug use: No  . Sexual activity: Not on file   Other Topics Concern  . Not on file   Social History Narrative   Married.   2 children.   Unemployed.   Enjoys exercising.    Family History  Problem Relation Age of Onset  . Alcohol abuse Mother   . Alcohol abuse Father   . Diabetes Maternal Grandmother   . Hypertension Maternal Grandmother   . Diabetes Paternal Grandmother   . Hypertension Paternal Grandmother     No Known Allergies  Medication list reviewed and updated in full in  Link.  GEN: No fevers, chills. Nontoxic. Primarily MSK c/o today. MSK: Detailed in the HPI GI: tolerating PO intake without difficulty Neuro: as above Otherwise the pertinent positives of the ROS are noted above.  Objective:   BP 140/90   Pulse 76   Temp 98.2 F (36.8 C) (Oral)   Ht 5\' 10"  (1.778 m)   Wt 210 lb 8 oz (95.5 kg)   BMI 30.20 kg/m    GEN: Well-developed,well-nourished,in no acute distress; alert,appropriate and cooperative throughout examination HEENT: Normocephalic and atraumatic without obvious abnormalities. Ears, externally no deformities PULM: Breathing comfortably in no respiratory distress EXT: No clubbing, cyanosis, or edema PSYCH: Normally interactive. Cooperative during the interview. Pleasant. Friendly and conversant. Not anxious or depressed appearing. Normal, full affect.  Range of motion at  the  waist: Flexion: normal Extension: normal Lateral bending: normal Rotation: all normal  No echymosis or edema Rises to examination table with no difficulty Gait: non antalgic  Inspection/Deformity: N Paraspinus Tenderness: minimal  B Ankle Dorsiflexion (L5,4): 5/5 B Great Toe Dorsiflexion (L5,4): 5/5 Heel Walk (L5): WNL Toe Walk (S1): WNL Rise/Squat (L4): WNL  SENSORY B Medial Foot (L4): WNL B Dorsum (L5): WNL B Lateral (S1): WNL Light Touch: WNL Pinprick: WNL  REFLEXES Knee (L4): 2+ Ankle (S1): 2+  B SLR, seated: neg B SLR, supine: neg B FABER: neg B Reverse FABER: POS B Greater Troch: NT B Log Roll: neg B Stork: NT B Sciatic Notch: NT    GEN: WDWN, NAD, Non-toxic, Alert & Oriented x 3 HEENT: Atraumatic, Normocephalic.  Ears and Nose: No external deformity. EXTR: No clubbing/cyanosis/edema NEURO: Normal gait.  PSYCH: Normally interactive. Conversant. Not depressed or anxious appearing.  Calm demeanor.   HIP EXAM: SIDE: b ROM: Abduction, Flexion, Internal and External range of motion: full Pain with terminal IROM and EROM: nt GTB: NT SLR: NEG Knees: No effusion FABER: NT REVERSE FABER: NT, neg Piriformis: NT at direct palpation Str: flexion: 5/5 abduction: 5/5 adduction: 5/5 Strength testing non-tender     Radiology: Dg Lumbar Spine Complete  Result Date: 01/26/2016 CLINICAL DATA:  left lower back pain. No injury, rule out abnormality to lumbar spine. EXAM: LUMBAR SPINE - COMPLETE 4+ VIEW COMPARISON:  None. FINDINGS: There is no evidence of lumbar spine fracture. Alignment is normal. Intervertebral disc spaces are maintained. IMPRESSION: Negative. Electronically Signed   By: Amie Portland M.D.   On: 01/26/2016 14:57   Assessment and Plan:   Sciatica, left side   This point the patient is doing quite a bit better as of today, though he was doing dramatically poorly even a few days ago and over the weekend.  I'm going to extend his prednisone by  5 days.  If he has return of symptoms, return of radiculopathy and dysfunctional pain, it would be reasonable at this point to do an MRI of his lumbar spine to evaluate for nerve encroachment, foraminal stenosis, spinal stenosis. If he has return of symptoms, then he will have failed traditional management and conservative care including physical therapy for multiple months.  Follow-up: No Follow-up on file.  Meds ordered this encounter  Medications  . predniSONE (DELTASONE) 20 MG tablet    Sig: 2 tabs po for 5 days    Dispense:  10 tablet    Refill:  0    Signed,  Leyana Whidden T. Unity Luepke, MD   Allergies as of 04/10/2016   No Known Allergies     Medication List       Accurate as of 04/10/16  1:59 PM. Always use your most recent med list.          diclofenac 75 MG EC tablet Commonly known as:  VOLTAREN Take 1 tablet (  75 mg total) by mouth 2 (two) times daily as needed for moderate pain.   methocarbamol 500 MG tablet Commonly known as:  ROBAXIN Take 1 tablet (500 mg total) by mouth every 8 (eight) hours as needed for muscle spasms.   predniSONE 10 MG tablet Commonly known as:  DELTASONE Taper as follows: 4-4-4-3-3-2-2   predniSONE 20 MG tablet Commonly known as:  DELTASONE 2 tabs po for 5 days

## 2016-04-10 NOTE — Progress Notes (Signed)
Pre visit review using our clinic review tool, if applicable. No additional management support is needed unless otherwise documented below in the visit note. 

## 2017-01-02 ENCOUNTER — Ambulatory Visit (INDEPENDENT_AMBULATORY_CARE_PROVIDER_SITE_OTHER): Payer: 59 | Admitting: Primary Care

## 2017-01-02 ENCOUNTER — Encounter: Payer: Self-pay | Admitting: Primary Care

## 2017-01-02 ENCOUNTER — Telehealth: Payer: Self-pay

## 2017-01-02 VITALS — BP 132/88 | HR 76 | Temp 98.2°F | Ht 70.0 in | Wt 200.8 lb

## 2017-01-02 DIAGNOSIS — F411 Generalized anxiety disorder: Secondary | ICD-10-CM | POA: Diagnosis not present

## 2017-01-02 DIAGNOSIS — F331 Major depressive disorder, recurrent, moderate: Secondary | ICD-10-CM | POA: Diagnosis not present

## 2017-01-02 MED ORDER — ESCITALOPRAM OXALATE 10 MG PO TABS: 10.0000 mg | ORAL_TABLET | Freq: Every day | ORAL | 1 refills | Status: DC

## 2017-01-02 NOTE — Telephone Encounter (Signed)
Patient evaluated this afternoon\in the office.

## 2017-01-02 NOTE — Progress Notes (Signed)
Subjective:    Patient ID: Jon English, male    DOB: [birth year], 36 y.o.   MRN: [id number]  HPI  Jon English is a 36 year old male who presents today with a chief complaint of depression. He walked into our clinic this morning reporting extreme depression that could not wait until tomorrow.  He's experiencing symptoms of feeling alone, occasional decreased motivation, feeling sad, irritability, difficulty sleeping. These symptoms began in 2017, thinks this also began in childhood as he had issues with his parents.  He and his wife are having marital problems that have been present for the past one year. He saw a therapist in late 2017 through early 2018 for a few months, then saw another therapist in mid 2018 for a second opinion. He did learn in therapy that his communication wasn't very effective, this was one of the problems he had with his wife. He also learned how to contain his emotions and prevent outbursts. Overall though, he didn't feel like therapy was enough. He's not seen a therapist since Summer 2018.  PHQ 9 score 17 and GAD 7 score of 15 today. He denies SI/HI.  Review of Systems  Constitutional: Positive for fatigue.  Respiratory: Negative for shortness of breath.   Cardiovascular: Negative for chest pain and palpitations.  Psychiatric/Behavioral: The patient is not nervous/anxious.        See HPI       Past Medical History:  Diagnosis Date  . Elevated blood pressure reading   . Migraines      Social History   Socioeconomic History  . Marital status: Married    Spouse name: Not on file  . Number of children: Not on file  . Years of education: Not on file  . Highest education level: Not on file  Social Needs  . Financial resource strain: Not on file  . Food insecurity - worry: Not on file  . Food insecurity - inability: Not on file  . Transportation needs - medical: Not on file  . Transportation needs - non-medical: Not on file  Occupational  History  . Not on file  Tobacco Use  . Smoking status: Never Smoker  . Smokeless tobacco: Never Used  Substance and Sexual Activity  . Alcohol use: No  . Drug use: No  . Sexual activity: Not on file  Other Topics Concern  . Not on file  Social History Narrative   Married.   2 children.   Unemployed.   Enjoys exercising.    No past surgical history on file.  Family History  Problem Relation Age of Onset  . Alcohol abuse Mother   . Alcohol abuse Father   . Diabetes Maternal Grandmother   . Hypertension Maternal Grandmother   . Diabetes Paternal Grandmother   . Hypertension Paternal Grandmother     No Known Allergies  Current Outpatient Medications on File Prior to Visit  Medication Sig Dispense Refill  . predniSONE (DELTASONE) 20 MG tablet Take 20 mg by mouth daily with breakfast.    . diclofenac (VOLTAREN) 75 MG EC tablet Take 1 tablet (75 mg total) by mouth 2 (two) times daily as needed for moderate pain. (Patient not taking: Reported on 01/02/2017) 60 tablet 0  . methocarbamol (ROBAXIN) 500 MG tablet Take 1 tablet (500 mg total) by mouth every 8 (eight) hours as needed for muscle spasms. (Patient not taking: Reported on 01/02/2017) 30 tablet 0   No current facility-administered medications on file prior to visit.  BP 132/88   Pulse 76   Temp 98.2 F (36.8 C) (Oral)   Ht 5\' 10"  (1.778 m)   Wt 200 lb 12.8 oz (91.1 kg)   SpO2 97%   BMI 28.81 kg/m    Objective:   Physical Exam  Constitutional: He appears well-nourished.  Neck: Neck supple.  Cardiovascular: Normal rate and regular rhythm.  Pulmonary/Chest: Effort normal and breath sounds normal.  Skin: Skin is warm and dry.  Psychiatric: He has a normal mood and affect.          Assessment & Plan:

## 2017-01-02 NOTE — Patient Instructions (Signed)
Start escitalopram (Lexapro) 10 mg tablets for depression and anxiety. Start by taking 1/2 tablet once daily for 8 days, then increase to 1 full tablet thereafter.  Please call or message me if you have any questions or problems with the medication.  Please schedule a follow up appointment in 6 weeks.  It was a pleasure to see you today!

## 2017-01-02 NOTE — Assessment & Plan Note (Signed)
Symptoms since 2017. PHQ 9 score of 17 today. Denies SI/HI. Discussed options for treatment, also recommended he reconnect with therapy.  Rx for Lexapro 10 mg sent to pharmacy. Patient is to take 1/2 tablet daily for 8 days, then advance to 1 full tablet thereafter. We discussed possible side effects of headache, GI upset, drowsiness, and SI/HI. If thoughts of SI/HI develop, we discussed to present to the emergency immediately. Patient verbalized understanding.   Follow up in 6 weeks for re-evaluation.

## 2017-01-02 NOTE — Telephone Encounter (Signed)
Pt walked in; has been depressed for the past year; worse today due to marital problems; 10 yrs ago on med for depression for few months. No SI or HI but pt thinks needs to be seen today. Allayne GitelmanK Clark NP will see pt today at 12:15. If pt condition or circumstance worsens prior to appt pt will go to ED if needed. FYI to Allayne GitelmanK Clark NP.

## 2017-01-02 NOTE — Assessment & Plan Note (Signed)
Symptoms since 2017. GAD 7 score of 15 today. Denies SI/HI. Discussed options for treatment, also recommended he reconnect with therapy.  Rx for Lexapro 10 mg sent to pharmacy. Patient is to take 1/2 tablet daily for 8 days, then advance to 1 full tablet thereafter. We discussed possible side effects of headache, GI upset, drowsiness, and SI/HI. If thoughts of SI/HI develop, we discussed to present to the emergency immediately. Patient verbalized understanding.   Follow up in 6 weeks for re-evaluation.

## 2017-01-03 ENCOUNTER — Ambulatory Visit: Payer: 59 | Admitting: Family Medicine

## 2017-01-19 ENCOUNTER — Encounter: Payer: Self-pay | Admitting: Primary Care

## 2017-02-10 ENCOUNTER — Encounter: Payer: Self-pay | Admitting: Primary Care

## 2017-02-10 DIAGNOSIS — F331 Major depressive disorder, recurrent, moderate: Secondary | ICD-10-CM

## 2017-02-10 DIAGNOSIS — F411 Generalized anxiety disorder: Secondary | ICD-10-CM

## 2017-02-11 MED ORDER — FLUOXETINE HCL 10 MG PO CAPS: 10.0000 mg | ORAL_CAPSULE | Freq: Every day | ORAL | 1 refills | Status: DC

## 2017-02-22 ENCOUNTER — Encounter: Payer: Self-pay | Admitting: Primary Care

## 2017-02-24 NOTE — Telephone Encounter (Signed)
Will you please call him to schedule something? I prefer 30 minutes if possible. Thanks!

## 2017-02-24 NOTE — Telephone Encounter (Signed)
Done. Patient is schedule for Wednesday at 4 pm

## 2017-02-26 ENCOUNTER — Ambulatory Visit (INDEPENDENT_AMBULATORY_CARE_PROVIDER_SITE_OTHER): Payer: 59 | Admitting: Primary Care

## 2017-02-26 ENCOUNTER — Encounter: Payer: Self-pay | Admitting: Primary Care

## 2017-02-26 DIAGNOSIS — F411 Generalized anxiety disorder: Secondary | ICD-10-CM

## 2017-02-26 DIAGNOSIS — F331 Major depressive disorder, recurrent, moderate: Secondary | ICD-10-CM | POA: Diagnosis not present

## 2017-02-26 NOTE — Progress Notes (Signed)
Subjective:    Patient ID: Jon English, male    DOB: 03-28-1980, 37 y.o.   MRN: [id number]  HPI  Jon English is a 37 year old male with a history of anxiety and depression who presents today for follow up.  He was evaluated in late December 2018 for complaints of depression including symptoms of feeling alone, decreased motivation to do anything, irritability, difficulty sleeping.  He and his wife are having marital problems which were causing a lot of his symptoms.  GAD 7 score of 15 and PHQ 9 score of 17 during his visit. After a long discussion he was initiated on Lexapro 10 mg and told to follow up in 6 weeks for re-evaluation.   He sent a my chart message in early January 2019 with reports of drowsiness and decrease in appetite especially after advancing to a full tablet, so we kept him at Lexapro 5 mg and recommended to take at bedtime. He sent another my chart message in late January 2019 with reports of continued drowsiness so his prescription was switched to fluoxetine 10 mg. He sent another my chart message one week ago reporting that the last "48 hours" had been very "difficult" and that he needed to speak with someone soon.  Since initiation of fluoxetine 10 mg he's doing better.  Was a difficult time he was describing lasted just 48 hours. Since then his mood has improved as he's feeling less down and sad. Irritability has improved. He denies SI/HI. GAD 7 score and PHQ 9 score of 4 today.  Review of Systems  Respiratory: Negative for shortness of breath.   Cardiovascular: Negative for chest pain.  Gastrointestinal: Negative for abdominal pain and nausea.  Psychiatric/Behavioral: Negative for sleep disturbance and suicidal ideas. The patient is not nervous/anxious.        Past Medical History:  Diagnosis Date  . Elevated blood pressure reading   . Migraines      Social History   Socioeconomic History  . Marital status: Married    Spouse name: Not on file  .  Number of children: Not on file  . Years of education: Not on file  . Highest education level: Not on file  Social Needs  . Financial resource strain: Not on file  . Food insecurity - worry: Not on file  . Food insecurity - inability: Not on file  . Transportation needs - medical: Not on file  . Transportation needs - non-medical: Not on file  Occupational History  . Not on file  Tobacco Use  . Smoking status: Never Smoker  . Smokeless tobacco: Never Used  Substance and Sexual Activity  . Alcohol use: No  . Drug use: No  . Sexual activity: Not on file  Other Topics Concern  . Not on file  Social History Narrative   Married.   2 children.   Unemployed.   Enjoys exercising.    No past surgical history on file.  Family History  Problem Relation Age of Onset  . Alcohol abuse Mother   . Alcohol abuse Father   . Diabetes Maternal Grandmother   . Hypertension Maternal Grandmother   . Diabetes Paternal Grandmother   . Hypertension Paternal Grandmother     No Known Allergies  Current Outpatient Medications on File Prior to Visit  Medication Sig Dispense Refill  . FLUoxetine (PROZAC) 10 MG capsule Take 1 capsule (10 mg total) by mouth daily. 30 capsule 1  . diclofenac (VOLTAREN) 75 MG EC tablet Take  1 tablet (75 mg total) by mouth 2 (two) times daily as needed for moderate pain. (Patient not taking: Reported on 01/02/2017) 60 tablet 0  . methocarbamol (ROBAXIN) 500 MG tablet Take 1 tablet (500 mg total) by mouth every 8 (eight) hours as needed for muscle spasms. (Patient not taking: Reported on 01/02/2017) 30 tablet 0   No current facility-administered medications on file prior to visit.     BP (!) 148/86   Pulse 69   Temp 98 F (36.7 C) (Oral)   Ht 5\' 10"  (1.778 m)   Wt 191 lb 8 oz (86.9 kg)   SpO2 98%   BMI 27.48 kg/m    Objective:   Physical Exam  Constitutional: He is oriented to person, place, and time. He appears well-nourished.  Neck: Neck supple.    Cardiovascular: Normal rate and regular rhythm.  Pulmonary/Chest: Effort normal and breath sounds normal. He has no wheezes. He has no rales.  Neurological: He is alert and oriented to person, place, and time.  Skin: Skin is warm and dry.  Psychiatric: He has a normal mood and affect.          Assessment & Plan:

## 2017-02-26 NOTE — Patient Instructions (Addendum)
Continue fluoxetine 10 mg once nightly.  Please message me if you have any more problems.  Start monitoring your blood pressure daily, around the same time of day, for the next several weeks.  Ensure that you have rested for 30 minutes prior to checking your blood pressure. Record your readings and notify me if it continues to run at or above 135/90.  It was a pleasure to see you today!   DASH Eating Plan DASH stands for "Dietary Approaches to Stop Hypertension." The DASH eating plan is a healthy eating plan that has been shown to reduce high blood pressure (hypertension). It may also reduce your risk for type 2 diabetes, heart disease, and stroke. The DASH eating plan may also help with weight loss. What are tips for following this plan? General guidelines  Avoid eating more than 2,300 mg (milligrams) of salt (sodium) a day. If you have hypertension, you may need to reduce your sodium intake to 1,500 mg a day.  Limit alcohol intake to no more than 1 drink a day for nonpregnant women and 2 drinks a day for men. One drink equals 12 oz of beer, 5 oz of wine, or 1 oz of hard liquor.  Work with your health care provider to maintain a healthy body weight or to lose weight. Ask what an ideal weight is for you.  Get at least 30 minutes of exercise that causes your heart to beat faster (aerobic exercise) most days of the week. Activities may include walking, swimming, or biking.  Work with your health care provider or diet and nutrition specialist (dietitian) to adjust your eating plan to your individual calorie needs. Reading food labels  Check food labels for the amount of sodium per serving. Choose foods with less than 5 percent of the Daily Value of sodium. Generally, foods with less than 300 mg of sodium per serving fit into this eating plan.  To find whole grains, look for the word "whole" as the first word in the ingredient list. Shopping  Buy products labeled as "low-sodium" or "no  salt added."  Buy fresh foods. Avoid canned foods and premade or frozen meals. Cooking  Avoid adding salt when cooking. Use salt-free seasonings or herbs instead of table salt or sea salt. Check with your health care provider or pharmacist before using salt substitutes.  Do not fry foods. Cook foods using healthy methods such as baking, boiling, grilling, and broiling instead.  Cook with heart-healthy oils, such as olive, canola, soybean, or sunflower oil. Meal planning   Eat a balanced diet that includes: ? 5 or more servings of fruits and vegetables each day. At each meal, try to fill half of your plate with fruits and vegetables. ? Up to 6-8 servings of whole grains each day. ? Less than 6 oz of lean meat, poultry, or fish each day. A 3-oz serving of meat is about the same size as a deck of cards. One egg equals 1 oz. ? 2 servings of low-fat dairy each day. ? A serving of nuts, seeds, or beans 5 times each week. ? Heart-healthy fats. Healthy fats called Omega-3 fatty acids are found in foods such as flaxseeds and coldwater fish, like sardines, salmon, and mackerel.  Limit how much you eat of the following: ? Canned or prepackaged foods. ? Food that is high in trans fat, such as fried foods. ? Food that is high in saturated fat, such as fatty meat. ? Sweets, desserts, sugary drinks, and other foods with  added sugar. ? Full-fat dairy products.  Do not salt foods before eating.  Try to eat at least 2 vegetarian meals each week.  Eat more home-cooked food and less restaurant, buffet, and fast food.  When eating at a restaurant, ask that your food be prepared with less salt or no salt, if possible. What foods are recommended? The items listed may not be a complete list. Talk with your dietitian about what dietary choices are best for you. Grains Whole-grain or whole-wheat bread. Whole-grain or whole-wheat pasta. Brown rice. [place]. Bulgur. Whole-grain and low-sodium  cereals. Pita bread. Low-fat, low-sodium crackers. Whole-wheat flour tortillas. Vegetables Fresh or frozen vegetables (raw, steamed, roasted, or grilled). Low-sodium or reduced-sodium tomato and vegetable juice. Low-sodium or reduced-sodium tomato sauce and tomato paste. Low-sodium or reduced-sodium canned vegetables. Fruits All fresh, dried, or frozen fruit. Canned fruit in natural juice (without added sugar). Meat and other protein foods Skinless chicken or [place]. Ground chicken or [place]. Pork with fat trimmed off. Fish and seafood. Egg whites. Dried beans, peas, or lentils. Unsalted nuts, nut butters, and seeds. Unsalted canned beans. Lean cuts of beef with fat trimmed off. Low-sodium, lean deli meat. Dairy Low-fat (1%) or fat-free (skim) milk. Fat-free, low-fat, or reduced-fat cheeses. Nonfat, low-sodium ricotta or cottage cheese. Low-fat or nonfat yogurt. Low-fat, low-sodium cheese. Fats and oils Soft margarine without trans fats. Vegetable oil. Low-fat, reduced-fat, or light mayonnaise and salad dressings (reduced-sodium). Canola, safflower, olive, soybean, and sunflower oils. Avocado. Seasoning and other foods Herbs. Spices. Seasoning mixes without salt. Unsalted popcorn and pretzels. Fat-free sweets. What foods are not recommended? The items listed may not be a complete list. Talk with your dietitian about what dietary choices are best for you. Grains Baked goods made with fat, such as croissants, muffins, or some breads. Dry pasta or rice meal packs. Vegetables Creamed or fried vegetables. Vegetables in a cheese sauce. Regular canned vegetables (not low-sodium or reduced-sodium). Regular canned tomato sauce and paste (not low-sodium or reduced-sodium). Regular tomato and vegetable juice (not low-sodium or reduced-sodium). Angie Fava. Olives. Fruits Canned fruit in a light or heavy syrup. Fried fruit. Fruit in cream or butter sauce. Meat and other protein foods Fatty cuts of meat. Ribs.  Fried meat. Berniece Salines. Sausage. Bologna and other processed lunch meats. Salami. Fatback. Hotdogs. Bratwurst. Salted nuts and seeds. Canned beans with added salt. Canned or smoked fish. Whole eggs or egg yolks. Chicken or [place] with skin. Dairy Whole or 2% milk, cream, and half-and-half. Whole or full-fat cream cheese. Whole-fat or sweetened yogurt. Full-fat cheese. Nondairy creamers. Whipped toppings. Processed cheese and cheese spreads. Fats and oils Butter. Stick margarine. Lard. Shortening. Ghee. Bacon fat. Tropical oils, such as coconut, palm kernel, or palm oil. Seasoning and other foods Salted popcorn and pretzels. Onion salt, garlic salt, seasoned salt, table salt, and sea salt. Worcestershire sauce. Tartar sauce. Barbecue sauce. Teriyaki sauce. Soy sauce, including reduced-sodium. Steak sauce. Canned and packaged gravies. Fish sauce. Oyster sauce. Cocktail sauce. Horseradish that you find on the shelf. Ketchup. Mustard. Meat flavorings and tenderizers. Bouillon cubes. Hot sauce and Tabasco sauce. Premade or packaged marinades. Premade or packaged taco seasonings. Relishes. Regular salad dressings. Where to find more information:  National Heart, Lung, and [place]: https://wilson-eaton.com/  American Heart Association: www.heart.org Summary  The DASH eating plan is a healthy eating plan that has been shown to reduce high blood pressure (hypertension). It may also reduce your risk for type 2 diabetes, heart disease, and stroke.  With the DASH  eating plan, you should limit salt (sodium) intake to 2,300 mg a day. If you have hypertension, you may need to reduce your sodium intake to 1,500 mg a day.  When on the DASH eating plan, aim to eat more fresh fruits and vegetables, whole grains, lean proteins, low-fat dairy, and heart-healthy fats.  Work with your health care provider or diet and nutrition specialist (dietitian) to adjust your eating plan to your individual calorie needs. This  information is not intended to replace advice given to you by your health care provider. Make sure you discuss any questions you have with your health care provider. Document Released: 12/20/2010 Document Revised: 12/25/2015 Document Reviewed: 12/25/2015 Elsevier Interactive Patient Education  [employer].

## 2017-02-26 NOTE — Assessment & Plan Note (Signed)
Much improved on fluoxetine 10 mg, continue same. PHQ 9 score of 4 today.  Denies SI/HI.

## 2017-02-26 NOTE — Assessment & Plan Note (Signed)
Much improved on fluoxetine 10 mg, continue same. GAD 7 score for today.

## 2017-02-28 ENCOUNTER — Ambulatory Visit: Payer: 59 | Admitting: Primary Care

## 2017-03-18 ENCOUNTER — Other Ambulatory Visit: Payer: Self-pay | Admitting: Primary Care

## 2017-03-18 DIAGNOSIS — F331 Major depressive disorder, recurrent, moderate: Secondary | ICD-10-CM

## 2017-03-18 DIAGNOSIS — F411 Generalized anxiety disorder: Secondary | ICD-10-CM

## 2017-10-13 ENCOUNTER — Telehealth: Payer: Self-pay

## 2017-10-13 NOTE — Telephone Encounter (Signed)
Copied from CRM [id number]. Topic: General - Other >> Oct 13, 2017  9:19 AM Gerrianne Scale wrote: Reason for CRM: pt upset calling stating that he cant get an appt with Chestine Spore on Thursday evening that he works and he cant get in to see his provider for back pain and BP issues I tried to make him an appt with another provider on Thursday evening pt got upset and wanted to be seen that day about both blood pressure and back pain which I advised him that he would have to f/u with [facility] Spore about his BP pt had gotten upset because someone had told him Thursday to Friday to get on Saturday appt and he wasn't able to get in at this office because there is no Saturday appt at this location I explained this to pt he still was upset I called twice and skyped plus my Manager did the same thing no respond

## 2017-10-13 NOTE — Telephone Encounter (Signed)
Noted  

## 2017-10-13 NOTE — Telephone Encounter (Signed)
I spoke with pt; pt said has been trying to be seen since last week for back pain and elevated BP; pt said each time he calls he is told something different. Pt was told Allayne Gitelman NP is only in office on thursdays in the morning time. Pt was told could be seen on Sat. Pt called back to schedule appt on Friday afternoon last week and was told could schedule appt but on Sat has to go to [facility] at [place] office. Pt did not want to do that. Pt called back this morning and was told would have to schedule more than one appt for back pain and BP issues. I apologized to pt for the communication issues and when [facility] skyped me earlier this morning I was talking with pt who had an emergent issue and helped pt to get to ED. 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 Pt gets off earlier on Thurs than any other day in the week and could come to [facility] on Sats. I explained that our office is not open on Sats but [facility] at [facility] office is from 9 AM - 1 PM. Pt said best if he was seen on Thurs, 10/16/17. I offered more than one appt but pt said could only come late on Thur afternoon. Pt scheduled 30 ' appt with Dr Ermalene Searing at 3:45 on 10/16/17. Pt has had low back pain in middle of back (pt could not specify how long) hx of sciatica; pain does not radiate down leg. No known injury; no UTI symptoms. pts last BP taken 10/12/17 at 135/90 something and pt does not have any symptoms; no CP, SOB, dizziness or H/A. Pt was appreciative for appt and advised if could be seen sooner with his work schedule to call [facility] and if condition worsened prior to appt to cb to [facility] or go to [facility] if at night. Pt voiced understanding. FYI to Allayne Gitelman NP and Dr Ermalene Searing.

## 2017-10-16 ENCOUNTER — Ambulatory Visit: Payer: 59 | Admitting: Family Medicine

## 2017-10-16 ENCOUNTER — Encounter: Payer: Self-pay | Admitting: Family Medicine

## 2017-10-16 VITALS — BP 130/92 | HR 80 | Temp 98.0°F | Ht 70.0 in | Wt 192.5 lb

## 2017-10-16 DIAGNOSIS — M545 Low back pain: Secondary | ICD-10-CM

## 2017-10-16 DIAGNOSIS — G8929 Other chronic pain: Secondary | ICD-10-CM

## 2017-10-16 DIAGNOSIS — I10 Essential (primary) hypertension: Secondary | ICD-10-CM | POA: Insufficient documentation

## 2017-10-16 LAB — POC URINALSYSI DIPSTICK (AUTOMATED)
BILIRUBIN UA: NEGATIVE
Blood, UA: NEGATIVE
Glucose, UA: NEGATIVE
KETONES UA: NEGATIVE
LEUKOCYTES UA: NEGATIVE
Nitrite, UA: NEGATIVE
Protein, UA: POSITIVE — AB
Spec Grav, UA: 1.025 (ref 1.010–1.025)
Urobilinogen, UA: 1 E.U./dL
pH, UA: 6 (ref 5.0–8.0)

## 2017-10-16 NOTE — Patient Instructions (Addendum)
Start home physical therapy for low back pain.  Please stop at the lab to have labs drawn.  get blood pressure cuff.. Check blood pressure daily at different times, record and call with measurments in next 1-2 weeks.  Work on healthy low salt diet, and regular exercise.

## 2017-10-16 NOTE — Assessment & Plan Note (Signed)
No pain on exam, pain is intermittent and mild.  Start with home back PT.Marland Kitchen Info given on stretches.

## 2017-10-16 NOTE — Progress Notes (Signed)
   Subjective:    Patient ID: Jon English, male    DOB: 02-Apr-1980, 37 y.o.   MRN: [id number]  HPI   37 year old male pt of [name] presents today with elevated blood pressures and low back pain.   He has noted mild stiffness and soreness in low back x months.  2-3/10  Last year had sciatica on left... That got better, but has continued having bilateral low back pain.   No current radiation of pain.  he notes after sleeping longer on weekends.  Better with up and moving, has not interfered with activity.  Once he gets to moving around it goes away.  no fall or associated trauma   No numbness, no weakness.  No dysuria.   Has not had  To take anything for pain.  Persistently elevated BP.Marland Kitchen Pt denies secondary to pain or anxiety He has started checking at [employer].Marland Kitchen Has been 145/93.  no HA, no dizziness, no CP, SOB,  No vision changes.   Average steps 20,00 per day  He has not previous Dx of HTN. BP Readings from Last 3 Encounters:  10/16/17 (!) 130/92  02/26/17 (!) 148/86  01/02/17 132/88   Grandparents with high blood pressure.  Body mass index is 27.62 kg/m.  Wt Readings from Last 3 Encounters:  10/16/17 192 lb 8 oz (87.3 kg)  02/26/17 191 lb 8 oz (86.9 kg)  01/02/17 200 lb 12.8 oz (91.1 kg)     He exercises, lifts weights, but does not eat as well as he used.   Review of Systems  Constitutional: Negative for fatigue and fever.  HENT: Negative for ear pain.   Eyes: Negative for pain.  Respiratory: Negative for cough and shortness of breath.   Cardiovascular: Negative for chest pain, palpitations and leg swelling.  Gastrointestinal: Negative for abdominal pain.  Genitourinary: Negative for dysuria.  Musculoskeletal: Negative for arthralgias.  Neurological: Negative for syncope, light-headedness and headaches.  Psychiatric/Behavioral: Negative for dysphoric mood.       Objective:   Physical Exam  Constitutional: Vital signs are normal. He appears  well-developed and well-nourished.  HENT:  Head: Normocephalic.  Right Ear: Hearing normal.  Left Ear: Hearing normal.  Nose: Nose normal.  Mouth/Throat: Oropharynx is clear and moist and mucous membranes are normal.  Neck: Trachea normal. Carotid bruit is not present. No thyroid mass and no thyromegaly present.  Cardiovascular: Normal rate, regular rhythm and normal pulses. Exam reveals no gallop, no distant heart sounds and no friction rub.  No murmur heard. No peripheral edema  Pulmonary/Chest: Effort normal and breath sounds normal. No respiratory distress.  Musculoskeletal:       Lumbar back: He exhibits normal range of motion, no tenderness and no bony tenderness.   Neg SLR bilaterally , neg faber's  Skin: Skin is warm, dry and intact. No rash noted.  Psychiatric: He has a normal mood and affect. His speech is normal and behavior is normal. Thought content normal.          Assessment & Plan:

## 2017-10-16 NOTE — Assessment & Plan Note (Signed)
He has had more than 3 elevated measurements at home and office.  Will eval for secondary cause and end organ damage.  Discussed healthy lifestyle changes.. eval risk factors for CVD.  Follow BP at home  ( pt to call with measurements in  1 week) and will consider starting medication after labs return.   Pt to follow up  With PCP in 1 month for re-eval, consider EKG to eval  For LVH at that time.

## 2017-10-17 LAB — CBC WITH DIFFERENTIAL/PLATELET
BASOS ABS: 0 10*3/uL (ref 0.0–0.1)
BASOS PCT: 0.9 % (ref 0.0–3.0)
EOS ABS: 0.4 10*3/uL (ref 0.0–0.7)
Eosinophils Relative: 7 % — ABNORMAL HIGH (ref 0.0–5.0)
HCT: 42.6 % (ref 39.0–52.0)
Hemoglobin: 14.1 g/dL (ref 13.0–17.0)
LYMPHS ABS: 1.7 10*3/uL (ref 0.7–4.0)
LYMPHS PCT: 33.4 % (ref 12.0–46.0)
MCHC: 33 g/dL (ref 30.0–36.0)
MCV: 91.5 fl (ref 78.0–100.0)
MONO ABS: 0.5 10*3/uL (ref 0.1–1.0)
Monocytes Relative: 9.4 % (ref 3.0–12.0)
NEUTROS ABS: 2.6 10*3/uL (ref 1.4–7.7)
Neutrophils Relative %: 49.3 % (ref 43.0–77.0)
PLATELETS: 201 10*3/uL (ref 150.0–400.0)
RBC: 4.65 Mil/uL (ref 4.22–5.81)
RDW: 13.8 % (ref 11.5–15.5)
WBC: 5.2 10*3/uL (ref 4.0–10.5)

## 2017-10-17 LAB — COMPREHENSIVE METABOLIC PANEL
ALT: 21 U/L (ref 0–53)
AST: 28 U/L (ref 0–37)
Albumin: 4.4 g/dL (ref 3.5–5.2)
Alkaline Phosphatase: 54 U/L (ref 39–117)
BILIRUBIN TOTAL: 1.8 mg/dL — AB (ref 0.2–1.2)
BUN: 18 mg/dL (ref 6–23)
CALCIUM: 9.6 mg/dL (ref 8.4–10.5)
CHLORIDE: 105 meq/L (ref 96–112)
CO2: 31 meq/L (ref 19–32)
CREATININE: 1.42 mg/dL (ref 0.40–1.50)
GFR: 71.54 mL/min (ref >60.00)
Glucose, Bld: 56 mg/dL — ABNORMAL LOW (ref 70–99)
Potassium: 3.9 mEq/L (ref 3.5–5.1)
SODIUM: 142 meq/L (ref 135–145)
Total Protein: 7.5 g/dL (ref 6.0–8.3)

## 2017-10-17 LAB — TSH: TSH: 1.25 u[IU]/mL (ref 0.35–4.50)

## 2018-10-29 LAB — NOVEL CORONAVIRUS, NAA: SARS-CoV-2, NAA: NOT DETECTED

## 2018-11-04 ENCOUNTER — Telehealth: Payer: Self-pay | Admitting: General Practice

## 2018-11-04 NOTE — Telephone Encounter (Signed)
Patient calling to check status of covid-19 results. Cannot find in Epic.

## 2018-11-05 ENCOUNTER — Encounter: Payer: Self-pay | Admitting: *Deleted

## 2018-11-05 ENCOUNTER — Telehealth: Payer: Self-pay | Admitting: *Deleted

## 2018-11-05 ENCOUNTER — Other Ambulatory Visit: Payer: Self-pay | Admitting: *Deleted

## 2018-11-05 DIAGNOSIS — Z20822 Contact with and (suspected) exposure to covid-19: Secondary | ICD-10-CM

## 2018-11-05 LAB — NOVEL CORONAVIRUS, NAA: SARS-CoV-2, NAA: NOT DETECTED

## 2018-11-05 NOTE — Telephone Encounter (Signed)
Pt called looking for his lab results for covid-19. He stated someone had left a message stating his specimen had been lost. He became irate when the results could not be given to him and was cursing.  He disconnected the call. When looking in his chart, there is a note stating [facility] was waiting to put the results in the chart.

## 2018-11-05 NOTE — Telephone Encounter (Signed)
Patient is calling again for the results of his Covid test but it cannot be found in Epic. Patient took the Covid-19 test a week ago on Friday October 30, 2018. Please advise.

## 2018-11-05 NOTE — Telephone Encounter (Signed)
Patient called and informed that COVID-19 test was negative/"not detected". Pt verbalized understanding. Test result from [facility] abstracted into chart.

## 2018-11-05 NOTE — Telephone Encounter (Signed)
Inquiry made to Isidoro Donning, [facility] Representative, about COVID test result.  Rec'd notification that pt. tested on 10/15, and resulted on 10/16; the result indicated the virus was "not detected".  Awaiting the result to be sent to the chart.  Attempted to contact pt.  Left general vm of the fact that result is completed, and awaiting the information to be placed in chart.  Did not give actual result per vm, since unable to know that it was pt's own personal voice message.  Advised to call [phone] for any questions.

## 2018-12-01 ENCOUNTER — Ambulatory Visit (INDEPENDENT_AMBULATORY_CARE_PROVIDER_SITE_OTHER): Payer: 59 | Admitting: Family Medicine

## 2018-12-01 ENCOUNTER — Encounter: Payer: Self-pay | Admitting: Family Medicine

## 2018-12-01 ENCOUNTER — Other Ambulatory Visit: Payer: Self-pay

## 2018-12-01 VITALS — BP 128/70 | HR 77 | Temp 97.3°F | Ht 70.0 in | Wt 208.4 lb

## 2018-12-01 DIAGNOSIS — M545 Low back pain, unspecified: Secondary | ICD-10-CM

## 2018-12-01 LAB — POC URINALSYSI DIPSTICK (AUTOMATED)
Blood, UA: NEGATIVE
Glucose, UA: NEGATIVE
Leukocytes, UA: NEGATIVE
Nitrite, UA: NEGATIVE
Protein, UA: POSITIVE — AB
Spec Grav, UA: 1.02 (ref 1.010–1.025)
Urobilinogen, UA: 0.2 E.U./dL
pH, UA: 6.5 (ref 5.0–8.0)

## 2018-12-01 MED ORDER — PREDNISONE 10 MG PO TABS: ORAL_TABLET | ORAL | 0 refills | Status: DC

## 2018-12-01 MED ORDER — METHOCARBAMOL 500 MG PO TABS: 500.0000 mg | ORAL_TABLET | Freq: Three times a day (TID) | ORAL | 0 refills | Status: DC | PRN

## 2018-12-01 NOTE — Progress Notes (Signed)
Subjective:    Patient ID: Jon English, male    DOB: 10/15/1980, 38 y.o.   MRN: [id number]  HPI  38 yo pt of NP Clark here with L lower back pain   Yesterday- just standing and hit him when he leaned forward  Wondered if it was sciatica  Pretty painful for the first hour  Pain radiated to L leg  Took ibuprofen and it helped - was at work   Sitting makes it worse  Bending forward as well and lat bend to L  No medicine today  No ice or heat -did not work last time    No numbness or weakness    Last LS film 2018 DG Lumbar Spine Complete (Accession [id number]) (Order [id number]) Imaging Date: 01/26/2016 [facility] Ordering/Authorizing: Doreene Nestlark, Katherine K, NP  Exam Information  Status Exam Begun  Exam Ended   Final [99] 01/26/2016 2:52 PM 01/26/2016 2:53 PM  PACS Intelerad Image Link  Show images for DG Lumbar Spine Complete  Study Result  CLINICAL DATA:  left lower back pain. No injury, rule out abnormality to lumbar spine.  EXAM: LUMBAR SPINE - COMPLETE 4+ VIEW  COMPARISON:  None.  FINDINGS: There is no evidence of lumbar spine fracture. Alignment is normal. Intervertebral disc spaces are maintained.  IMPRESSION: Negative.   Electronically Signed   By: Amie Portlandavid  Ormond M.D.   On: 01/26/2016 14:57   ua today Results for orders placed or performed in visit on 12/01/18  POCT Urinalysis Dipstick (Automated)  Result Value Ref Range   Color, UA Yellow    Clarity, UA Hazy    Glucose, UA Negative Negative   Bilirubin, UA Trace    Ketones, UA Small    Spec Grav, UA 1.020 1.010 - 1.025   Blood, UA Negative    pH, UA 6.5 5.0 - 8.0   Protein, UA Positive (A) Negative   Urobilinogen, UA 0.2 0.2 or 1.0 E.U./dL   Nitrite, UA Negative    Leukocytes, UA Negative Negative    Has not eaten since this am  Does drink water routinely   Patient Active Problem List   Diagnosis Date Noted  . Essential hypertension  10/16/2017  . Low back pain 10/16/2017  . Moderate episode of recurrent major depressive disorder (HCC) 01/02/2017  . GAD (generalized anxiety disorder) 01/02/2017   Past Medical History:  Diagnosis Date  . Elevated blood pressure reading   . Migraines    History reviewed. No pertinent surgical history. Social History   Tobacco Use  . Smoking status: Never Smoker  . Smokeless tobacco: Never Used  Substance Use Topics  . Alcohol use: No  . Drug use: No   Family History  Problem Relation Age of Onset  . Alcohol abuse Mother   . Alcohol abuse Father   . Diabetes Maternal Grandmother   . Hypertension Maternal Grandmother   . Diabetes Paternal Grandmother   . Hypertension Paternal Grandmother    No Known Allergies No current outpatient medications on file prior to visit.   No current facility-administered medications on file prior to visit.     Review of Systems  Constitutional: Negative for activity change, appetite change, fatigue, fever and unexpected weight change.  HENT: Negative for congestion, rhinorrhea, sore throat and trouble swallowing.   Eyes: Negative for pain, redness, itching and visual disturbance.  Respiratory: Negative for cough, chest tightness, shortness of breath and wheezing.   Cardiovascular: Negative for chest pain and palpitations.  Gastrointestinal:  Negative for abdominal pain, blood in stool, constipation, diarrhea and nausea.  Endocrine: Negative for cold intolerance, heat intolerance, polydipsia and polyuria.  Genitourinary: Negative for difficulty urinating, dysuria, frequency and urgency.  Musculoskeletal: Positive for back pain. Negative for arthralgias, gait problem, joint swelling and myalgias.  Skin: Negative for pallor and rash.  Neurological: Negative for dizziness, tremors, weakness, numbness and headaches.  Hematological: Negative for adenopathy. Does not bruise/bleed easily.  Psychiatric/Behavioral: Negative for decreased concentration  and dysphoric mood. The patient is not nervous/anxious.        Objective:   Physical Exam Constitutional:      General: He is not in acute distress.    Appearance: Normal appearance. He is well-developed. He is obese. He is not ill-appearing or diaphoretic.  HENT:     Head: Normocephalic and atraumatic.  Eyes:     General: No scleral icterus.    Conjunctiva/sclera: Conjunctivae normal.     Pupils: Pupils are equal, round, and reactive to light.  Neck:     Musculoskeletal: Normal range of motion and neck supple.  Cardiovascular:     Rate and Rhythm: Normal rate and regular rhythm.  Pulmonary:     Effort: Pulmonary effort is normal.     Breath sounds: Normal breath sounds. No wheezing or rales.  Abdominal:     General: Bowel sounds are normal. There is no distension.     Palpations: Abdomen is soft.     Tenderness: There is no abdominal tenderness.  Musculoskeletal:        General: Tenderness present.     Right shoulder: He exhibits spasm. He exhibits normal range of motion, no bony tenderness, no swelling, no crepitus, no deformity and normal strength.     Lumbar back: He exhibits decreased range of motion, tenderness and spasm. He exhibits no bony tenderness and no edema.     Comments: No bony tenderness Slight loss of lordosis Tight lumbar musculature especially on the L side   Flex-90 deg (pain past that) Ext-full  Flex R causes discomfort Nl gait w/o foot drop  Neg SLR  Lymphadenopathy:     Cervical: No cervical adenopathy.  Skin:    General: Skin is warm and dry.     Coloration: Skin is not pale.     Findings: No erythema or rash.  Neurological:     Mental Status: He is alert.     Cranial Nerves: No cranial nerve deficit.     Sensory: No sensory deficit.     Motor: No weakness, atrophy or abnormal muscle tone.     Coordination: Coordination normal.     Gait: Gait normal.     Deep Tendon Reflexes: Reflexes are normal and symmetric. Reflexes normal.      Comments: Negative SLR  Psychiatric:        Mood and Affect: Mood normal.           Assessment & Plan:   Problem List Items Addressed This Visit      Other   Low back pain - Primary    Recurrent L sided low back pain -that radiates to the leg  Does a fair amt of lifting in job  Reassuring exam today-no neuro s/s  Rev nl LS film report from 2018   In the past- he responded best to prednisone course 40 mg taper sent in /disc poss side eff Adv stretches-given handout on rehab for sciatica  Update if not starting to improve in a week or if worsening  May need to consider re imaging and /or MRI if worse  Has done PT in the past Also sent methocarbamol for prn use with caution of sedation       Relevant Medications   methocarbamol (ROBAXIN) 500 MG tablet   predniSONE (DELTASONE) 10 MG tablet   Other Relevant Orders   POCT Urinalysis Dipstick (Automated) (Completed)

## 2018-12-01 NOTE — Patient Instructions (Addendum)
Take prednisone as directed  It may make you hyper and hungry while you are on it  Take it with food   Try the muscle relaxer if needed -methocarbamol  Use caution of sedation   Be careful lifting   Update if not starting to improve in a week or if worsening

## 2018-12-01 NOTE — Assessment & Plan Note (Signed)
Recurrent L sided low back pain -that radiates to the leg  Does a fair amt of lifting in job  Reassuring exam today-no neuro s/s  Rev nl LS film report from 2018   In the past- he responded best to prednisone course 40 mg taper sent in /disc poss side eff Adv stretches-given handout on rehab for sciatica  Update if not starting to improve in a week or if worsening   May need to consider re imaging and /or MRI if worse  Has done PT in the past Also sent methocarbamol for prn use with caution of sedation

## 2018-12-07 ENCOUNTER — Other Ambulatory Visit: Payer: Self-pay | Admitting: Family Medicine

## 2018-12-08 ENCOUNTER — Other Ambulatory Visit: Payer: Self-pay | Admitting: Family Medicine

## 2018-12-09 NOTE — Telephone Encounter (Signed)
Refill not appropriate

## 2018-12-09 NOTE — Telephone Encounter (Signed)
Last prescribed on 12/01/2018 by Dr Glori Bickers. Last appointment on 12/01/2018. No future appointment

## 2018-12-18 ENCOUNTER — Other Ambulatory Visit: Payer: Self-pay

## 2018-12-18 ENCOUNTER — Ambulatory Visit (INDEPENDENT_AMBULATORY_CARE_PROVIDER_SITE_OTHER)
Admission: RE | Admit: 2018-12-18 | Discharge: 2018-12-18 | Disposition: A | Discharge status: Home / Self Care | Payer: 59 | Source: Ambulatory Visit | Attending: Family Medicine | Admitting: Family Medicine

## 2018-12-18 ENCOUNTER — Ambulatory Visit (INDEPENDENT_AMBULATORY_CARE_PROVIDER_SITE_OTHER): Payer: 59 | Admitting: Family Medicine

## 2018-12-18 ENCOUNTER — Encounter: Payer: Self-pay | Admitting: Family Medicine

## 2018-12-18 VITALS — BP 120/58 | HR 86 | Temp 97.4°F | Ht 70.0 in | Wt 210.2 lb

## 2018-12-18 DIAGNOSIS — M545 Low back pain, unspecified: Secondary | ICD-10-CM

## 2018-12-18 DIAGNOSIS — G8929 Other chronic pain: Secondary | ICD-10-CM

## 2018-12-18 MED ORDER — MELOXICAM 15 MG PO TABS: 15.0000 mg | ORAL_TABLET | Freq: Every day | ORAL | 1 refills | Status: DC | PRN

## 2018-12-18 NOTE — Patient Instructions (Signed)
Start meloxicam 15 mg with food once daily as needed for back pain   Xray now -we will contact you with results   Keep doing the stretches Try some heat to relax muscles - for 10 minutes at a time   We may consider PT again

## 2018-12-18 NOTE — Progress Notes (Signed)
Subjective:    Patient ID: Jon English, male    DOB: 07-24-1980, 38 y.o.   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   recurrent L sided low back pain radiating to the L leg with reassuring exam  Also rev his LS film report from 2018  Had done PT in the past   Prednisone taper px along with methocarbamol Given rehab/stretch handout for sciatica   Per pt his symptoms resolved for several days  Then when moving around at work-it came back  The rehab exercises are hard/hurt   Now hurts L side low back- not as much in the leg No n/t   Muscle relaxer does help -esp at night /able to sleep   Still uncomfortable to sit  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 Essential hypertension 10/16/2017  . Low back pain 10/16/2017  . Moderate episode of recurrent major depressive disorder (HCC) 01/02/2017  . GAD (generalized anxiety disorder) 01/02/2017   Past Medical History:  Diagnosis Date  . Elevated blood pressure reading   . Migraines    History reviewed. No pertinent surgical history. Social History   Tobacco Use  . Smoking status: Never Smoker  . Smokeless tobacco: Never Used  Substance Use Topics  . Alcohol use: No  . Drug use: No   Family History  Problem Relation Age of Onset  . Alcohol abuse Mother   . Alcohol abuse Father   . Diabetes Maternal Grandmother   . Hypertension Maternal Grandmother   . Diabetes Paternal Grandmother   . Hypertension Paternal Grandmother    No Known Allergies Current Outpatient Medications on File Prior to Visit  Medication Sig Dispense Refill  . methocarbamol (ROBAXIN) 500 MG tablet Take 1 tablet (500 mg total) by mouth every 8 (eight) hours as needed for muscle spasms. Caution of sedation 30 tablet 0   No current  facility-administered medications on file prior to visit.     Review of Systems  Constitutional: Negative for activity change, appetite change, fatigue, fever and unexpected weight change.  HENT: Negative for congestion, rhinorrhea, sore throat and trouble swallowing.   Eyes: Negative for pain, redness, itching and visual disturbance.  Respiratory: Negative for cough, chest tightness, shortness of breath and wheezing.   Cardiovascular: Negative for chest pain and palpitations.  Gastrointestinal: Negative for abdominal pain, blood in stool, constipation, diarrhea and nausea.  Endocrine: Negative for cold intolerance, heat intolerance, polydipsia and polyuria.  Genitourinary: Negative for difficulty urinating, dysuria, frequency and urgency.  Musculoskeletal: Positive for back pain. Negative for arthralgias, joint swelling and myalgias.  Skin: Negative for pallor and rash.  Neurological: Negative for dizziness, tremors, weakness, numbness and headaches.  Hematological: Negative for adenopathy. Does not bruise/bleed easily.  Psychiatric/Behavioral: Negative for decreased concentration and dysphoric mood. The patient is not nervous/anxious.        Objective:   Physical Exam Constitutional:      General: He is not in acute distress.    Appearance: Normal appearance. He is well-developed. He is obese. He is not ill-appearing.  HENT:     Head: Normocephalic and atraumatic.  Eyes:     General: No scleral icterus.    Conjunctiva/sclera: Conjunctivae normal.  Pupils: Pupils are equal, round, and reactive to light.  Neck:     Musculoskeletal: Normal range of motion and neck supple.  Cardiovascular:     Rate and Rhythm: Normal rate and regular rhythm.  Pulmonary:     Effort: Pulmonary effort is normal.     Breath sounds: Normal breath sounds. No wheezing or rales.  Abdominal:     General: Bowel sounds are normal. There is no distension.     Palpations: Abdomen is soft.     Tenderness:  There is no abdominal tenderness.  Musculoskeletal:        General: Tenderness present.     Right shoulder: He exhibits decreased range of motion, tenderness and spasm. He exhibits no bony tenderness, no swelling, no crepitus and no deformity.     Lumbar back: He exhibits decreased range of motion, tenderness and spasm. He exhibits no bony tenderness and no edema.     Comments: L lumbar muscular tenderness Some pain with piriformis stretch Nl rom of hips  LS -flex- 90 deg/ ext 10 deg  Nl rotation Mild pain to lat flex L     Lymphadenopathy:     Cervical: No cervical adenopathy.  Skin:    General: Skin is warm and dry.     Coloration: Skin is not pale.     Findings: No erythema or rash.  Neurological:     Mental Status: He is alert.     Cranial Nerves: No cranial nerve deficit.     Sensory: No sensory deficit.     Motor: No atrophy or abnormal muscle tone.     Coordination: Coordination normal.     Deep Tendon Reflexes: Reflexes are normal and symmetric.     Comments: Negative SLR  Psychiatric:        Mood and Affect: Mood normal.           Assessment & Plan:   Problem List Items Addressed This Visit      Other   Low back pain - Primary    Recurrent low back pain with intermittent radiculopathy to L leg Improved with prednisone and returned PT ref made Trial of meloxicam  LS film to follow/pend rad rev Disc s/s of neuro complications Update if not starting to improve in a week or if worsening        Relevant Medications   meloxicam (MOBIC) 15 MG tablet   Other Relevant Orders   DG Lumbar Spine Complete (Completed)

## 2018-12-20 NOTE — Assessment & Plan Note (Signed)
Recurrent low back pain with intermittent radiculopathy to L leg Improved with prednisone and returned PT ref made Trial of meloxicam  LS film to follow/pend rad rev Disc s/s of neuro complications Update if not starting to improve in a week or if worsening

## 2020-03-10 ENCOUNTER — Other Ambulatory Visit: Payer: Self-pay

## 2020-03-10 ENCOUNTER — Ambulatory Visit (INDEPENDENT_AMBULATORY_CARE_PROVIDER_SITE_OTHER): Payer: No Typology Code available for payment source | Admitting: Primary Care

## 2020-03-10 VITALS — BP 170/110 | HR 50 | Temp 98.4°F | Ht 70.0 in | Wt 207.5 lb

## 2020-03-10 DIAGNOSIS — I10 Essential (primary) hypertension: Secondary | ICD-10-CM

## 2020-03-10 LAB — COMPREHENSIVE METABOLIC PANEL
ALT: 19 U/L (ref 0–53)
AST: 26 U/L (ref 0–37)
Albumin: 4.5 g/dL (ref 3.5–5.2)
Alkaline Phosphatase: 47 U/L (ref 39–117)
BUN: 17 mg/dL (ref 6–23)
CO2: 32 mEq/L (ref 19–32)
Calcium: 9.4 mg/dL (ref 8.4–10.5)
Chloride: 99 mEq/L (ref 96–112)
Creatinine, Ser: 1.22 mg/dL (ref 0.40–1.50)
GFR: 73.93 mL/min (ref >60.00)
Glucose, Bld: 104 mg/dL — ABNORMAL HIGH (ref 70–99)
Potassium: 4 mEq/L (ref 3.5–5.1)
Sodium: 135 mEq/L (ref 135–145)
Total Bilirubin: 2.4 mg/dL — ABNORMAL HIGH (ref 0.2–1.2)
Total Protein: 7.1 g/dL (ref 6.0–8.3)

## 2020-03-10 LAB — LIPID PANEL
Cholesterol: 178 mg/dL (ref 0–200)
HDL: 51 mg/dL (ref >39.00)
LDL Cholesterol: 119 mg/dL — ABNORMAL HIGH (ref 0–99)
NonHDL: 127.15
Total CHOL/HDL Ratio: 3
Triglycerides: 40 mg/dL (ref 0.0–149.0)
VLDL: 8 mg/dL (ref 0.0–40.0)

## 2020-03-10 LAB — TSH: TSH: 1.91 u[IU]/mL (ref 0.35–4.50)

## 2020-03-10 LAB — CBC
HCT: 43.2 % (ref 39.0–52.0)
Hemoglobin: 14.5 g/dL (ref 13.0–17.0)
MCHC: 33.6 g/dL (ref 30.0–36.0)
MCV: 90.8 fl (ref 78.0–100.0)
Platelets: 187 10*3/uL (ref 150.0–400.0)
RBC: 4.76 Mil/uL (ref 4.22–5.81)
RDW: 13.4 % (ref 11.5–15.5)
WBC: 3.6 10*3/uL — ABNORMAL LOW (ref 4.0–10.5)

## 2020-03-10 LAB — HEMOGLOBIN A1C: Hgb A1c MFr Bld: 5.6 % (ref 4.6–6.5)

## 2020-03-10 MED ORDER — AMLODIPINE BESYLATE 5 MG PO TABS: ORAL_TABLET | ORAL | 0 refills | Status: DC

## 2020-03-10 NOTE — Progress Notes (Signed)
Subjective:    Patient ID: Jon English, male    DOB: 20-Oct-1980, 40 y.o.   MRN: [id number]  HPI  This visit occurred during the SARS-CoV-2 public health emergency.  Safety protocols were in place, including screening questions prior to the visit, additional usage of staff PPE, and extensive cleaning of exam room while observing appropriate contact time as indicated for disinfecting solutions.   Jon English is a 40 year old male with a history of hypertension, depression, anxiety, low back pain who presents today to discuss blood pressure.  I have not seen him since February 2019, he was last seen in our practice in December 2020 by Dr. Milinda Antis.  He's been checking his blood pressure at [employer] and various other places which is running 150's-180's/80's-90's. 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 He endorses a family history of hypertension in grandparents, is unsure if his parents had hypertension.  He is unaware of any family history of stroke/heart attack.  He endorses an improvement in diet and exercise over the years, has cut out fatty/fried food, he drinks mostly water. He denies headaches, dizziness, chest pain, blurred vision.   He has never been treated for hypertension in the past  Wt Readings from Last 3 Encounters:  03/10/20 207 lb 8 oz (94.1 kg)  12/18/18 210 lb 3 oz (95.3 kg)  12/01/18 208 lb 7 oz (94.5 kg)    BP Readings from Last 3 Encounters:  03/10/20 (!) 170/110  12/18/18 (!) 120/58  12/01/18 128/70     Review of Systems  Eyes: Negative for visual disturbance.  Respiratory: Negative for shortness of breath.   Cardiovascular: Negative for chest pain.  Neurological: Negative for dizziness and headaches.       Past Medical History:  Diagnosis Date  . Elevated blood pressure reading   . Migraines      Social History   Socioeconomic History  . Marital status: Married    Spouse name: Not on file  .  Number of children: Not on file  . Years of education: Not on file  . Highest education level: Not on file  Occupational History  . Not on file  Tobacco Use  . Smoking status: Never Smoker  . Smokeless tobacco: Never Used  Substance and Sexual Activity  . Alcohol use: No  . Drug use: No  . Sexual activity: Not on file  Other Topics Concern  . Not on file  Social History Narrative   Married.   2 children.   Unemployed.   Enjoys exercising.   Social Determinants of Health   Financial Resource Strain: Not on file  Food Insecurity: Not on file  Transportation Needs: Not on file  Physical Activity: Not on file  Stress: Not on file  Social Connections: Not on file  Intimate Partner Violence: Not on file    No past surgical history on file.  Family History  Problem Relation Age of Onset  . Alcohol abuse Mother   . Alcohol abuse Father   . Diabetes Maternal Grandmother   . Hypertension Maternal Grandmother   . Diabetes Paternal Grandmother   . Hypertension Paternal Grandmother     No Known Allergies  No current outpatient medications on file prior to visit.   No current facility-administered medications on file prior to visit.    BP (!) 170/110   Pulse (!) 50   Temp 98.4 F (36.9 C) (Temporal)   Ht 5\' 10"  (1.778 m)  Wt 207 lb 8 oz (94.1 kg)   SpO2 97%   BMI 29.77 kg/m    Objective:   Physical Exam Constitutional:      Appearance: He is well-nourished.  Cardiovascular:     Rate and Rhythm: Normal rate and regular rhythm.  Pulmonary:     Effort: Pulmonary effort is normal.     Breath sounds: Normal breath sounds.  Musculoskeletal:     Cervical back: Neck supple.  Skin:    General: Skin is warm and dry.  Psychiatric:        Mood and Affect: Mood and affect normal.            Assessment & Plan:

## 2020-03-10 NOTE — Assessment & Plan Note (Signed)
Uncontrolled today, also above goal during several other checks including DOT physical at [facility].  Asymptomatic.  Family history of hypertension.  Given his healthy lifestyle changes over the years, we will proceed with medication treatment.  Prescription for amlodipine 5 mg at the pharmacy.  Take 1 tablet by mouth once daily x1 week, then increase to 2 tablets once daily thereafter.  We will also check some labs to rule out other causes including TSH, A1c, CMP, CBC.  We will also plan to see him back in 2 to 3 weeks for blood pressure check.

## 2020-03-10 NOTE — Patient Instructions (Signed)
Stop by the lab prior to leaving today. I will notify you of your results once received.   Start amlodipine 5 mg once daily for blood pressure. Take 1 tablet once daily for one week, then increase to 2 tablets once daily thereafter.  Please schedule a follow up visit to meet back with me in 2-3 weeks for blood pressure check.   It was a pleasure to see you today!

## 2020-03-29 ENCOUNTER — Ambulatory Visit: Payer: 59 | Admitting: Primary Care

## 2020-03-31 ENCOUNTER — Ambulatory Visit (INDEPENDENT_AMBULATORY_CARE_PROVIDER_SITE_OTHER): Payer: No Typology Code available for payment source | Admitting: Primary Care

## 2020-03-31 ENCOUNTER — Other Ambulatory Visit: Payer: Self-pay

## 2020-03-31 ENCOUNTER — Encounter: Payer: Self-pay | Admitting: Primary Care

## 2020-03-31 DIAGNOSIS — I10 Essential (primary) hypertension: Secondary | ICD-10-CM

## 2020-03-31 MED ORDER — AMLODIPINE BESYLATE 10 MG PO TABS: 10.0000 mg | ORAL_TABLET | Freq: Every day | ORAL | 3 refills | Status: DC

## 2020-03-31 NOTE — Assessment & Plan Note (Signed)
Improved and at goal on amlodipine 10 mg, new Rx written and sent to pharmacy.   Continue 10 mg amlodipine daily.

## 2020-03-31 NOTE — Patient Instructions (Signed)
Continue taking amlodipine 10 mg for blood pressure.  It was a pleasure to see you today!

## 2020-03-31 NOTE — Progress Notes (Signed)
Subjective:    Patient ID: Jon English, male    DOB: [birth year], 40 y.o.   MRN: [id number]  HPI  Jon English is a very pleasant 40 y.o. male with a history of hypertension, anxiety and depression, back pain who presents today for follow-up of hypertension.  He was last evaluated on 03/10/2020 for evaluation of hypertension, he had been checking his blood pressure at [facility] and various other places and was seeing readings consistently ranging 150-180/80'-90's.  Reading during his office visit with [place] was 170/110 so we proceeded with treatment, amlodipine 5 mg x 1 week, then 10 mg thereafter.  He is here today for follow-up.  Since his last visit he's checked his BP a few times which has ranged the 150's-160's/? He denies chest pain, headaches, dizziness. He believes the pharmacy gave him an in correct amount of medication as he ran out three days ago. He was up to two tablets daily as prescribed.   BP Readings from Last 3 Encounters:  03/31/20 126/82  03/10/20 (!) 170/110  12/18/18 (!) 120/58      Review of Systems  Respiratory: Negative for shortness of breath.   Cardiovascular: Negative for chest pain.  Neurological: Negative for dizziness and headaches.         Past Medical History:  Diagnosis Date  . Elevated blood pressure reading   . Migraines     Social History   Socioeconomic History  . Marital status: Married    Spouse name: Not on file  . Number of children: Not on file  . Years of education: Not on file  . Highest education level: Not on file  Occupational History  . Not on file  Tobacco Use  . Smoking status: Never Smoker  . Smokeless tobacco: Never Used  Substance and Sexual Activity  . Alcohol use: No  . Drug use: No  . Sexual activity: Not on file  Other Topics Concern  . Not on file  Social History Narrative   Married.   2 children.   Unemployed.   Enjoys exercising.   Social Determinants of Health   Financial Resource  Strain: Not on file  Food Insecurity: Not on file  Transportation Needs: Not on file  Physical Activity: Not on file  Stress: Not on file  Social Connections: Not on file  Intimate Partner Violence: Not on file    No past surgical history on file.  Family History  Problem Relation Age of Onset  . Alcohol abuse Mother   . Alcohol abuse Father   . Diabetes Maternal Grandmother   . Hypertension Maternal Grandmother   . Diabetes Paternal Grandmother   . Hypertension Paternal Grandmother     No Known Allergies  No current outpatient medications on file prior to visit.   No current facility-administered medications on file prior to visit.    BP 126/82   Pulse 81   Temp (!) 97.1 F (36.2 C) (Temporal)   Ht 5\' 10"  (1.778 m)   Wt 206 lb (93.4 kg)   SpO2 98%   BMI 29.56 kg/m  Objective:   Physical Exam Cardiovascular:     Rate and Rhythm: Normal rate and regular rhythm.  Pulmonary:     Effort: Pulmonary effort is normal.     Breath sounds: Normal breath sounds. No wheezing or rales.  Musculoskeletal:     Cervical back: Neck supple.  Skin:    General: Skin is warm and dry.  Neurological:     Mental Status:  He is alert and oriented to person, place, and time.           Assessment & Plan:      This visit occurred during the SARS-CoV-2 public health emergency.  Safety protocols were in place, including screening questions prior to the visit, additional usage of staff PPE, and extensive cleaning of exam room while observing appropriate contact time as indicated for disinfecting solutions.

## 2020-05-23 IMAGING — DX DG LUMBAR SPINE COMPLETE 4+V
5 series · 5 of 5 positions shown · non-contrast
Comparison: 01/26/2016

CLINICAL DATA: Low back pain

EXAM:
LUMBAR SPINE - COMPLETE 4+ VIEW

[l-spine ap]
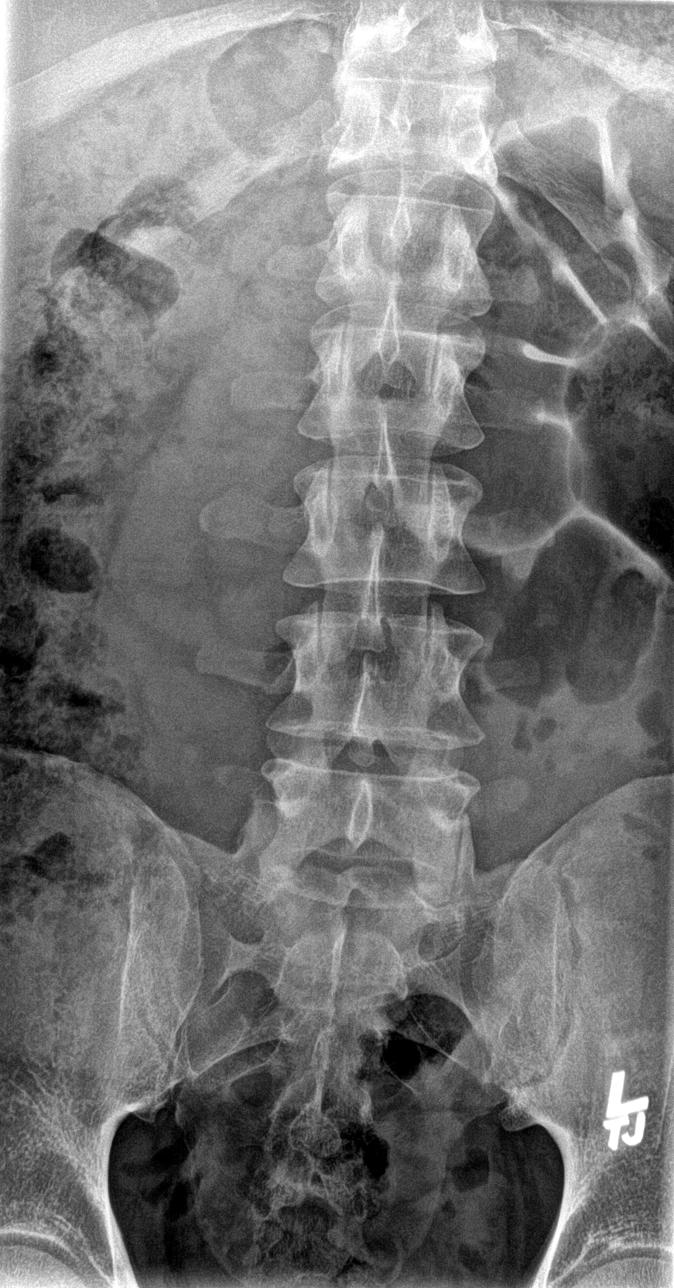

[l-spine obl (1 of 2)]
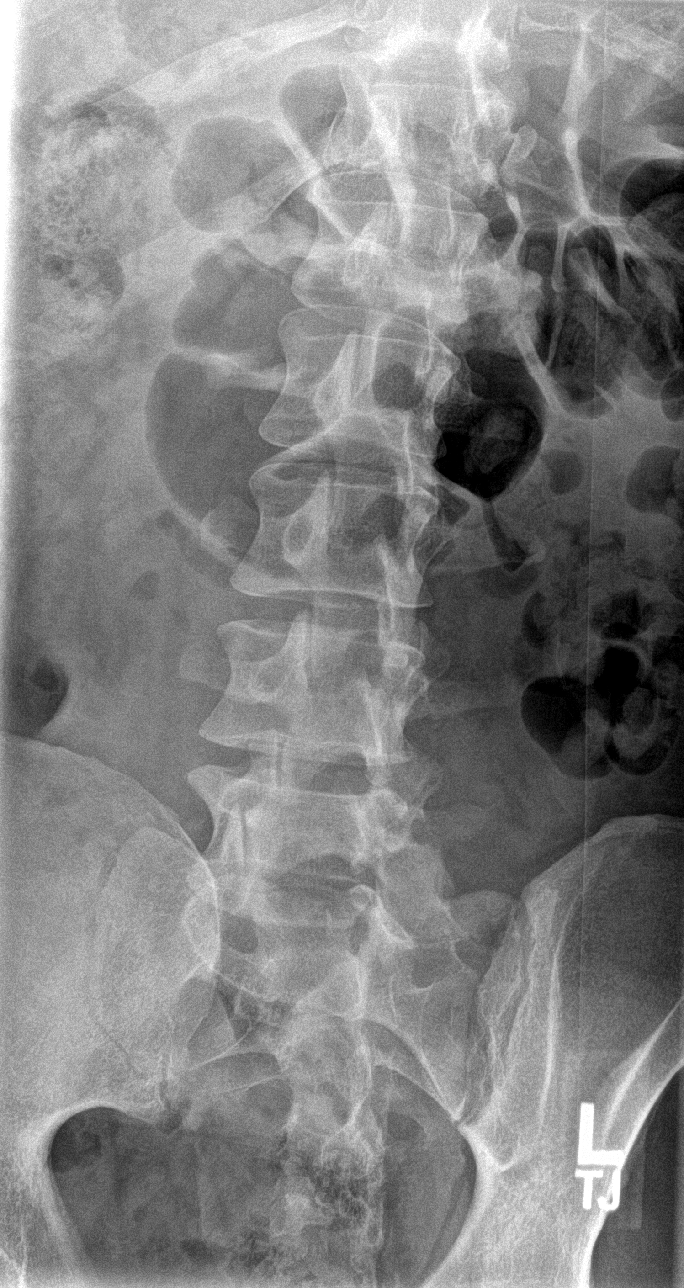

[l-spine obl (2 of 2)]
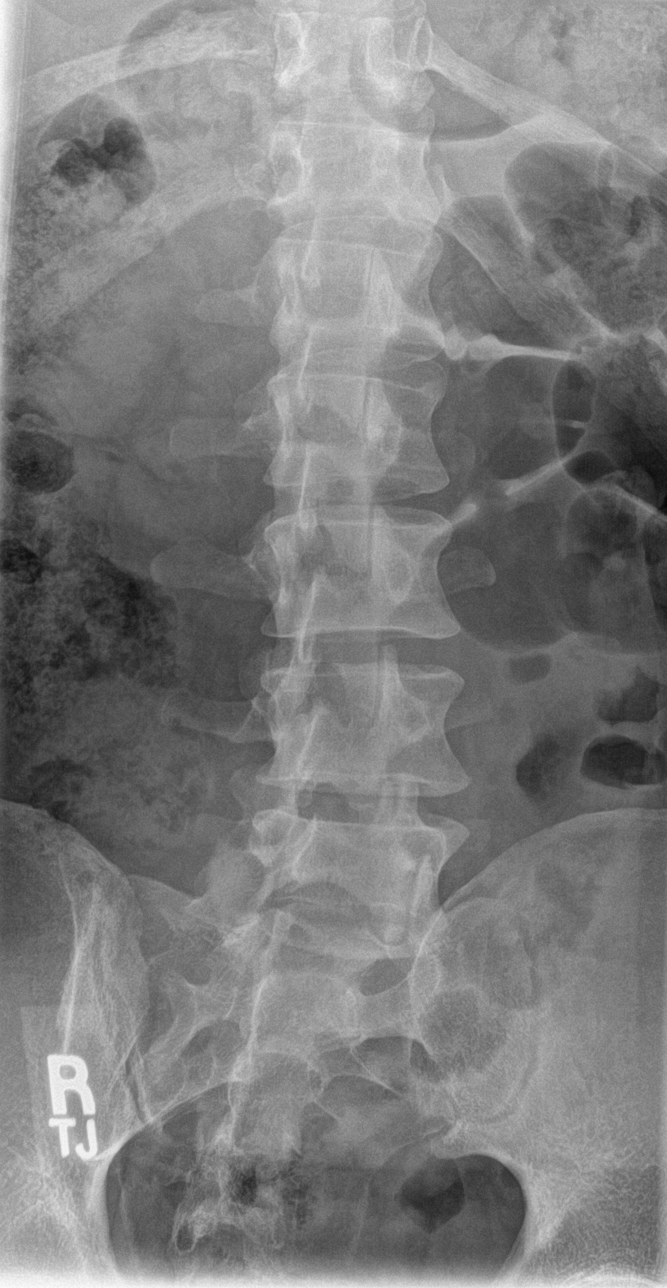

[l-spine lat]
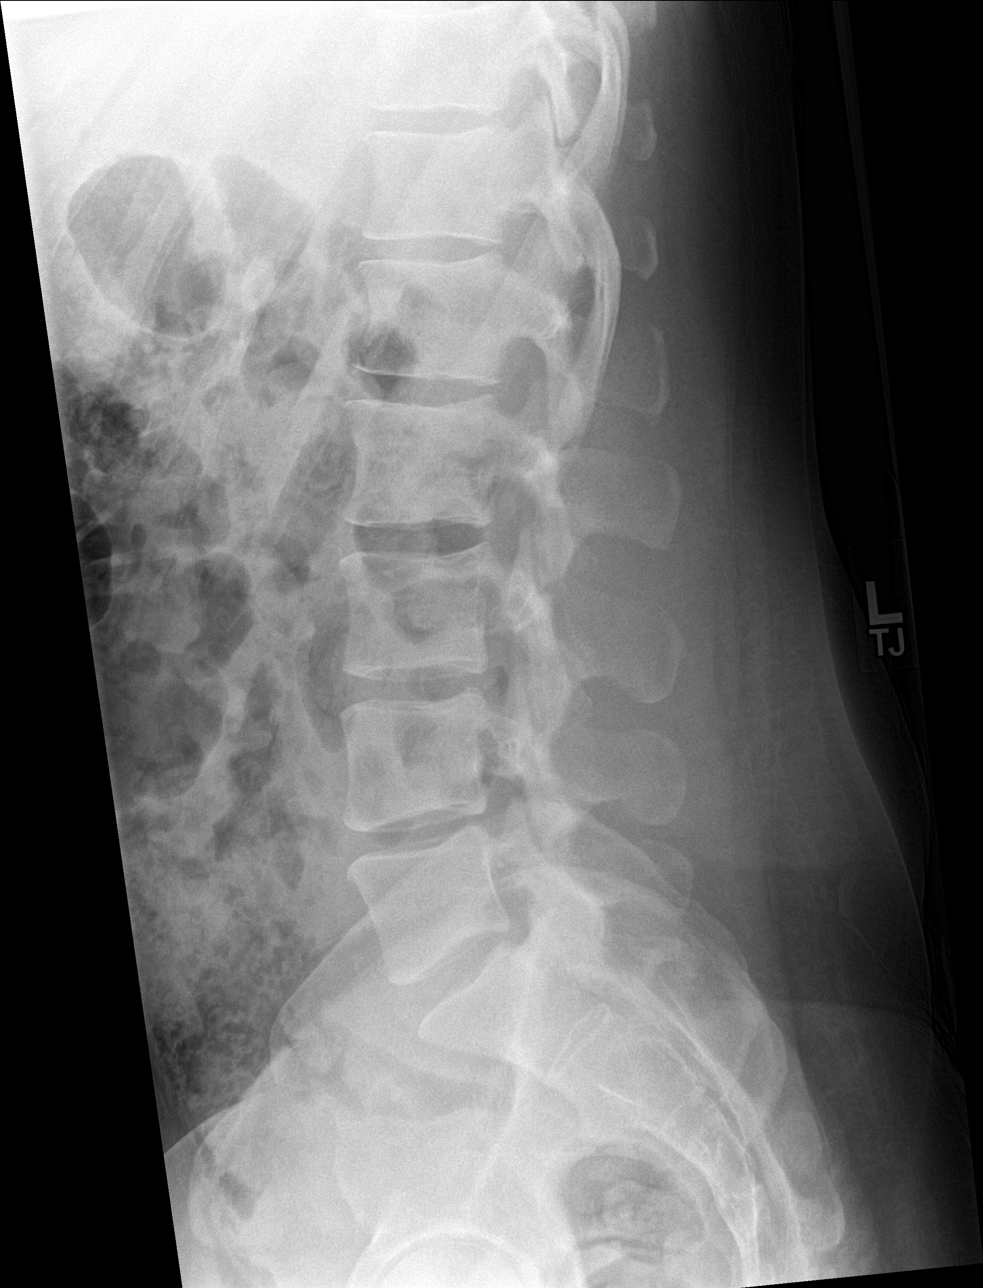

[l-spine l5/s1]
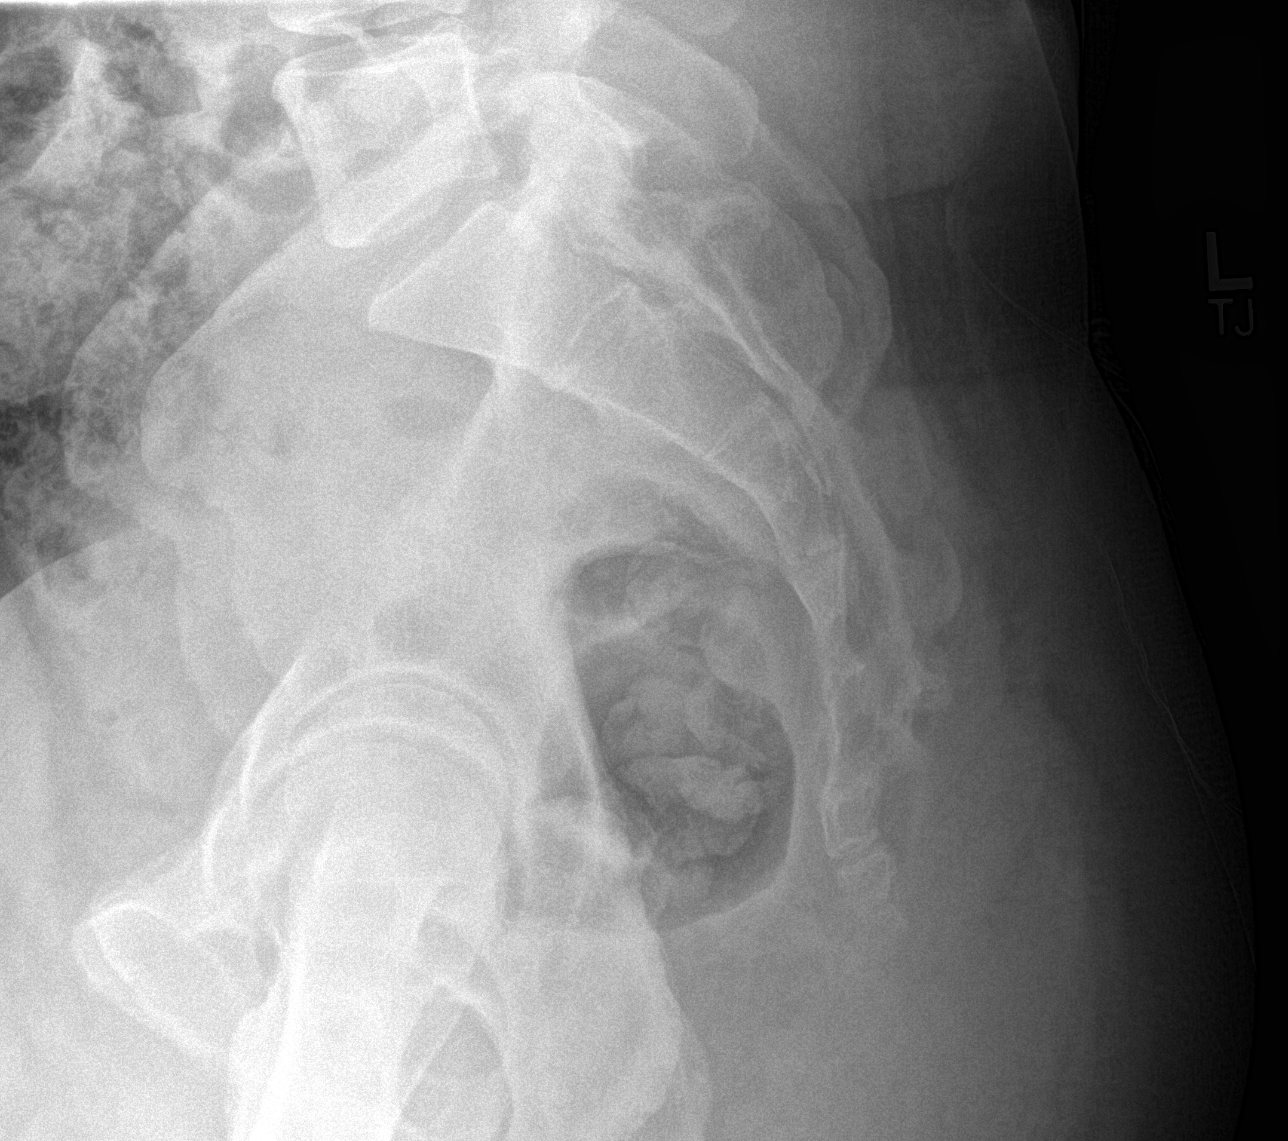

[5 of 5 positions shown; findings below may reference images not displayed]

FINDINGS: Lumbar alignment is normal. Interval mild disc space narrowing at
L4-L5. Remaining disc spaces appear maintained.
IMPRESSION: Interval minimal disc space narrowing at L4-L5.  Otherwise negative.

## 2020-06-06 ENCOUNTER — Encounter: Payer: Self-pay | Admitting: Family Medicine

## 2020-06-06 ENCOUNTER — Other Ambulatory Visit: Payer: Self-pay

## 2020-06-06 ENCOUNTER — Ambulatory Visit (INDEPENDENT_AMBULATORY_CARE_PROVIDER_SITE_OTHER): Payer: No Typology Code available for payment source | Admitting: Family Medicine

## 2020-06-06 DIAGNOSIS — M79606 Pain in leg, unspecified: Secondary | ICD-10-CM

## 2020-06-06 NOTE — Progress Notes (Signed)
This visit occurred during the SARS-CoV-2 public health emergency.  Safety protocols were in place, including screening questions prior to the visit, additional usage of staff PPE, and extensive cleaning of exam room while observing appropriate contact time as indicated for disinfecting solutions.  Leg pain. 06/01/20 noted soreness in R thigh when sitting, pain in the mid body of the right hamstring.  Also had pain in the R quad when walking.  Took ibuprofen.  Was able to sleep.  Initially "It was not excruciating.  Then next day it was worse."  Fortunately, less bad in the meantime.  H/o sciatica per patient report, also on R side, and that went all the way down to his foot.  No pain past the knee with this episode.  No L sided pain with this episode.  No FCNAVD.  No B/B sx.  No rash.  No trauma.  Some lifting at work, 30-50 lbs at a time.  He is working out at baseline.  No bruising.  Normal skin sensation.  He doesn't do squats at the gym.  Hasn't missed work.  No back pain.    Meds, vitals, and allergies reviewed.   ROS: Per HPI unless specifically indicated in ROS section   nad ncat Neck supple, no LA rrr ctab abd soft, not ttp Extremities well perfused.  Skin without rash.  Straight leg raise negative on right leg.  Strength and sensation normal lower extremities. Right hamstring is slightly tight on testing. He has incidental right lateral supple conjunctival hemorrhage noted, per patient this is already improving.

## 2020-06-06 NOTE — Patient Instructions (Signed)
Likely hamstring strain.  Try icing 5 minutes at a time with fabric between the ice and you.  Gently stretch.  Ibuprofen with food if needed. Update [place] as needed.  Take care.  Glad to see you.

## 2020-06-07 DIAGNOSIS — M79606 Pain in leg, unspecified: Secondary | ICD-10-CM | POA: Insufficient documentation

## 2020-06-07 NOTE — Assessment & Plan Note (Signed)
Does not have typical sciatica symptoms.  Anatomy and differential discussed with patient.  Would not need imaging at this point. Likely hamstring strain.  Try icing 5 minutes at a time with fabric between the ice and skin.  Gently stretch.  Ibuprofen with food if needed. Update [place] as needed.  He agrees with plan.

## 2020-07-21 ENCOUNTER — Ambulatory Visit (INDEPENDENT_AMBULATORY_CARE_PROVIDER_SITE_OTHER): Payer: No Typology Code available for payment source | Admitting: Primary Care

## 2020-07-21 ENCOUNTER — Other Ambulatory Visit: Payer: Self-pay

## 2020-07-21 ENCOUNTER — Encounter: Payer: Self-pay | Admitting: Primary Care

## 2020-07-21 DIAGNOSIS — M545 Low back pain, unspecified: Secondary | ICD-10-CM | POA: Diagnosis not present

## 2020-07-21 DIAGNOSIS — G8929 Other chronic pain: Secondary | ICD-10-CM | POA: Diagnosis not present

## 2020-07-21 MED ORDER — CYCLOBENZAPRINE HCL 5 MG PO TABS: 5.0000 mg | ORAL_TABLET | Freq: Every evening | ORAL | 0 refills | Status: DC | PRN

## 2020-07-21 NOTE — Progress Notes (Signed)
Subjective:    Patient ID: Jon English, male    DOB: [birth year], 40 y.o.   MRN: [id number]  HPI  Jon English is a very pleasant 40 y.o. male with a history of chronic back pain who presents today to discuss back discomfort.  He denies pain, describes his symptoms as "tightness" and stiffness which is located to the bilateral lower back.   His back pain began about five years ago, has noticed more symptoms this past week as he's been on vacation and is resting at home. He has an active job and is not bothered by his pain during working hours. He does a lot of heavy lifting, repetitive movement at work. Occasionally he will have discomfort at night when sleeping.   This week his pain was worse when sleeping longer than 7 hours, sitting for prolonged periods of time. He has completed physical therapy in the past, he endorses no improvement.   He denies numbness/tingling, loss of bowel/bladder control. He's not taken anything OTC for symptoms. His chronic back pain has not progressed over the years.   He was last evaluated by Dr. Para March in May 2022 for lower extremity pain that was presumed to be hamstring strain. This has resolved.   Along with his activity level at work he is very active in the gym. He stretches each time he works out.  BP Readings from Last 3 Encounters:  07/21/20 118/62  06/06/20 118/88  03/31/20 126/82      Review of Systems  Musculoskeletal:  Positive for back pain.  Neurological:  Negative for weakness and numbness.        Past Medical History:  Diagnosis Date   Elevated blood pressure reading    Migraines     Social History   Socioeconomic History   Marital status: Married    Spouse name: Not on file   Number of children: Not on file   Years of education: Not on file   Highest education level: Not on file  Occupational History   Not on file  Tobacco Use   Smoking status: Never   Smokeless tobacco: Never  Substance and  Sexual Activity   Alcohol use: No   Drug use: No   Sexual activity: Not on file  Other Topics Concern   Not on file  Social History Narrative   Married.   2 children.   Unemployed.   Enjoys exercising.   Social Determinants of Health   Financial Resource Strain: Not on file  Food Insecurity: Not on file  Transportation Needs: Not on file  Physical Activity: Not on file  Stress: Not on file  Social Connections: Not on file  Intimate Partner Violence: Not on file    History reviewed. No pertinent surgical history.  Family History  Problem Relation Age of Onset   Alcohol abuse Mother    Alcohol abuse Father    Diabetes Maternal Grandmother    Hypertension Maternal Grandmother    Diabetes Paternal Grandmother    Hypertension Paternal Grandmother     No Known Allergies  Current Outpatient Medications on File Prior to Visit  Medication Sig Dispense Refill   amLODipine (NORVASC) 10 MG tablet Take 1 tablet (10 mg total) by mouth daily. For blood pressure. 90 tablet 3   No current facility-administered medications on file prior to visit.    BP 118/62   Pulse 76   Temp 98.6 F (37 C) (Temporal)   Ht 5\' 10"  (1.778 m)   Wt 197  lb (89.4 kg)   SpO2 98%   BMI 28.27 kg/m  Objective:   Physical Exam Pulmonary:     Effort: Pulmonary effort is normal.  Musculoskeletal:     Lumbar back: No tenderness or bony tenderness. Normal range of motion. Negative right straight leg raise test and negative left straight leg raise test.       Back:  Neurological:     Mental Status: He is alert.          Assessment & Plan:      This visit occurred during the SARS-CoV-2 public health emergency.  Safety protocols were in place, including screening questions prior to the visit, additional usage of staff PPE, and extensive cleaning of exam room while observing appropriate contact time as indicated for disinfecting solutions.

## 2020-07-21 NOTE — Patient Instructions (Signed)
You may take the cyclobenzaprine 5 mg tablets at bedtime for muscle tightness to the back. This will cause drowsiness.   Stretch your lower back everyday.   Continue regular activity.  It was a pleasure to see you today!

## 2020-07-21 NOTE — Assessment & Plan Note (Signed)
No progression, suspect his more sedentary activity this week while on vacation has contributed to noticing symptoms.  Exam today unremarkable. No alarm signs on exam or HPI.  Commended him on regular exercise and activity level. Encouraged daily stretching.  Rx for Flexeril 5 mg provided to use PRN HS for rest. He will update if symptoms progress.

## 2020-10-16 ENCOUNTER — Telehealth: Payer: Self-pay

## 2020-10-16 NOTE — Telephone Encounter (Signed)
[facility] Day - Client TELEPHONE ADVICE RECORD [facility] Patient Name: Jon English Gender: Male DOB: 03-10-1980 Age: 40 Y 6 M 20 D Return Phone Number: [phone] (Primary), [phone] (Secondary) Address: City/ State/ Zip: [place] [address] Client [facility] Day - Client Client Site [facility] - Day Contact Type Call Who Is Calling Patient / Member / Family / Caregiver Call Type Triage / Clinical Relationship To Patient Self Return Phone Number [phone] (Primary) Chief Complaint Arm Pain (no known cause) Reason for Call Symptomatic / Request for Health Information Initial Comment caller being transferred from office to be triage // no appt today Caller having right arm pain / like a pulling for the past few weeks Translation No Nurse Assessment Nurse: Hum[place]et, RN, Marchelle Folks Date/Time (Eastern Time): 10/16/2020 3:40:22 PM Confirm and document reason for call. If symptomatic, describe symptoms. ---caller states he has not had injury. right arm started hurting about 3 weeks ago says it hurts when he lifts weights Does the patient have any new or worsening symptoms? ---Yes Will a triage be completed? ---Yes Related visit to physician within the last 2 weeks? ---N/A Does the PT have any chronic conditions? (i.e. diabetes, asthma, this includes High risk factors for pregnancy, etc.) ---Yes List chronic conditions. ---HTN Is this a behavioral health or substance abuse call? ---No Guidelines Guideline Title Affirmed Question Affirmed Notes Nurse Date/Time (Eastern Time) Arm Pain [1] MODERATE pain (e.g., interferes with normal activities) AND [2] present > 3 days Humfleet, RN, Marchelle Folks 10/16/2020 3:40:55 PM Disp. Time Lamount Cohen Time) Disposition Final User 10/16/2020 3:30:11 PM Attempt made - message left Humfleet, RN, Marchelle Folks 10/16/2020 3:43:35 PM SEE PCP WITHIN 3 DAYS Yes  Humfleet, RN, Marchelle Folks PLEASE NOTE: All timestamps contained within this report are represented as [place] Standard Time. CONFIDENTIALTY NOTICE: This fax transmission is intended only for the addressee. It contains information that is legally privileged, confidential or otherwise protected from use or disclosure. If you are not the intended recipient, you are strictly prohibited from reviewing, disclosing, copying using or disseminating any of this information or taking any action in reliance on or regarding this information. If you have received this fax in error, please notify us immediately by telephone so that we can arrange for its return to [place]. Phone: [phone], Toll-Free: [phone], Fax: [phone] Page: 2 of 2 Call Id: [id number] Caller Disagree/Comply Comply Caller Understands Yes PreDisposition Call Doctor Care Advice Given Per Guideline * You need to be seen within 2 or 3 days. SEE PCP WITHIN 3 DAYS: * PCP VISIT: Call your doctor (or NP/PA) during regular office hours and make an appointment. A clinic or urgent care center are good places to go for care if your doctor's office is closed or you can't get an appointment. NOTE: If office will be open tomorrow, tell caller to call then, not in 3 days. CARE ADVICE given per Arm Pain (Adult) guideline. CALL BACK IF: * You become worse * Severe pain occurs Referrals Warm transfer to backline REFERRED TO PCP OFFICE

## 2020-10-16 NOTE — Telephone Encounter (Signed)
I spoke with pt to see if he wanted to schedule sooner appt with a provider than 10/20/20. Pt said he works and Caleen [facility] is the best day for him; I advised OK but Jae Dire does not have available appt on Fri  until 10/27/20. Pt said he has seen Dr Para March before and would prefer to see him. Scheduled appt with Dr Para March on 10/20/20 at 12:30 at [facility]. Pt voiced  understanding and pt also request that I put in for pt to have a change in PCP from Mayra Reel NP to Dr Para March. Sending note to Dr Para March and Mayra Reel NP. Pt is not having CP and pt said he started having rt arm pain after sleeping on shoulder 3 wks ago. Pt said when he lifts weights it causes more pain in rt arm. I advised pt not to lift wts if that caused more rt arm pain and pt voiced understanding. Pt was driving when I spoke with pt and he asked that I either my chart or cb and leave v//m with [facility] info which I did call and leave [facility] info for pt on v/m. UC & ED precautions given and pt voiced understanding. Sending note to Allayne Gitelman NP and Dr Para March.

## 2020-10-17 NOTE — Telephone Encounter (Signed)
Fine with me, thanks. 

## 2020-10-17 NOTE — Telephone Encounter (Signed)
Will see at OV.  Thanks.  

## 2020-10-20 ENCOUNTER — Ambulatory Visit (INDEPENDENT_AMBULATORY_CARE_PROVIDER_SITE_OTHER): Payer: No Typology Code available for payment source | Admitting: Family Medicine

## 2020-10-20 ENCOUNTER — Encounter: Payer: Self-pay | Admitting: Family Medicine

## 2020-10-20 ENCOUNTER — Other Ambulatory Visit: Payer: Self-pay

## 2020-10-20 ENCOUNTER — Ambulatory Visit: Payer: No Typology Code available for payment source | Admitting: Nurse Practitioner

## 2020-10-20 DIAGNOSIS — M25511 Pain in right shoulder: Secondary | ICD-10-CM

## 2020-10-20 NOTE — Patient Instructions (Signed)
Use the shoulder exercises and let me know if this isn't getting better.  Likely rotator cuff irritation.  Take care.  Glad to see you.

## 2020-10-20 NOTE — Progress Notes (Signed)
This visit occurred during the SARS-CoV-2 public health emergency.  Safety protocols were in place, including screening questions prior to the visit, additional usage of staff PPE, and extensive cleaning of exam room while observing appropriate contact time as indicated for disinfecting solutions.  Prev leg pain resolved, d/w pt.    R arm pain.  Going on for about 3 weeks.  "I slept on it" and then had pain.  No pain with ROM but pain lifting at the gym.  No severe pain but not typical and is irritating.  No L sided sx.  No known trauma, no trigger o/w.  No FCNAVD.  Feels well o/w.  No R leg sx.  Pain near deltoid, upper lateral R arm.  Normal grip. No pop or snap.    Meds, vitals, and allergies reviewed.   ROS: Per HPI unless specifically indicated in ROS section   Nad Ncat Neck supple. Right shoulder with normal arm range of motion and no arm drop.  Jon English has good internal and external rotation and right supraspinatus testing is negative.  AC joint is not tender.  Jon English does have right shoulder impingement that can be induced on exam but this is improved with scapular manipulation. Distally neurovascularly intact with normal grip.

## 2020-10-22 DIAGNOSIS — M25511 Pain in right shoulder: Secondary | ICD-10-CM | POA: Insufficient documentation

## 2020-10-22 NOTE — Assessment & Plan Note (Signed)
Likely rotator cuff irritation with scapular dysfunction that could be improved with home exercise program.  Anatomy and home exercise program discussed with patient and explained.  Exercises demonstrated.  He can start with those and update me as needed.  Okay for outpatient follow-up.  He agrees with plan.

## 2021-04-06 ENCOUNTER — Other Ambulatory Visit: Payer: Self-pay | Admitting: Primary Care

## 2021-04-06 DIAGNOSIS — I10 Essential (primary) hypertension: Secondary | ICD-10-CM

## 2021-05-04 ENCOUNTER — Other Ambulatory Visit: Payer: Self-pay | Admitting: Primary Care

## 2021-05-04 DIAGNOSIS — I10 Essential (primary) hypertension: Secondary | ICD-10-CM

## 2021-07-09 ENCOUNTER — Other Ambulatory Visit: Payer: Self-pay | Admitting: Primary Care

## 2021-07-09 DIAGNOSIS — I10 Essential (primary) hypertension: Secondary | ICD-10-CM

## 2021-07-09 NOTE — Telephone Encounter (Signed)
Office visit required for further refills.  Please schedule. Let me know once he's scheduled.

## 2021-08-08 ENCOUNTER — Telehealth: Payer: Self-pay | Admitting: Primary Care

## 2021-08-08 ENCOUNTER — Other Ambulatory Visit: Payer: Self-pay | Admitting: Primary Care

## 2021-08-08 DIAGNOSIS — I10 Essential (primary) hypertension: Secondary | ICD-10-CM

## 2021-08-08 MED ORDER — AMLODIPINE BESYLATE 10 MG PO TABS: 10.0000 mg | ORAL_TABLET | Freq: Every day | ORAL | 0 refills | Status: DC

## 2021-08-08 NOTE — Telephone Encounter (Signed)
Patient called and stated he need to make an appointment to get his blood pressure medicine. The times that we had available for him he wasn't able to make. Patient also stated why do I have to come in to make an appointment for medication and spoke with patient and told him you have not been seen in over a year. He stated this don't make no sense and wanted to get another PCP.

## 2021-08-08 NOTE — Telephone Encounter (Signed)
Called patient and advised him that he needs to see his PCP for the follow-up on his blood pressure. Patient stated that he is only able to come for an appointment on a Friday because of his schedule. Patient stated that he has been told many times by the front office that Mayra Reel NP does not work on Thursday  or Fridays. Patient stated that he has gotten tired of hearing that. Patient was advised that Jae Dire does usually work those days and he may have requested a day that she was off. Patient stated that he will be out of his medication tomorrow. Patient stated that he requested last year to be switched to Dr. Para March and did not hear back about that. Patient was advised that Dr. Para March has not been taking new patients for over a year and I am sorry that he was not told that. Patient was offered to be worked in tomorrow with Mayra Reel NP which he said that he can only come on a Friday. Patient scheduled for an appointment with Mayra Reel NP  08/17/21 at 3:20 pm. Patient would like enough pills sent to [facility] to last him until his appointment next week. Appointment cancelled with Dr. Milinda Antis.

## 2021-08-08 NOTE — Addendum Note (Signed)
Addended by: Doreene Nest on: 08/08/2021 05:06 PM   Modules accepted: Orders

## 2021-08-08 NOTE — Telephone Encounter (Signed)
Patient was transferred to me to see if I could address questions when resolution could not be obtained with Jon English. I was on the phone with patient for over 10 minutes. His complaint is that 1 he needs refill. 2. States that he left message several months ago to change to a doctor. He was upset that we had not contacted him to change to another provider. Advised that there is no documentation in chart. Let him know that unfortunately we do not currently have any doctors taking new patients at this time. I have given him multiple times that Jon English has opening that he can be seen for refills. Declined all times/dates. States that the only time he can come in is after 12 on this Friday. He will be out of medications after that. I  set him up with a appointment with Jon English to get short term refill to last until he can establish with new practice at his request.  Sending to [facility] for review she will discuss with Jon English to make sure she is agreeable to this plan.

## 2021-08-08 NOTE — Telephone Encounter (Signed)
He needs to see pcp

## 2021-08-08 NOTE — Telephone Encounter (Signed)
Noted. Will evaluate patient as scheduled. Will provide refill of amlodipine to get him to his appointment next week.  Appreciate everyone's time and attention for this patient.

## 2021-08-08 NOTE — Telephone Encounter (Addendum)
Patient has not been seen for hypertension follow up since March 2022. I request patients follow up at least once annually for refills of any medication, he is overdue and I have kindly been refilling.   At this point, he will need to be seen for ongoing refills. I don't believe Dr. Milinda Antis should have to see this patient for his follow up, but I will defer this to her.   I have multiple openings for which have been offered to him. I am happy to add him on tomorrow (08/09/21) to refill his medication until he can find another PCP.   I do see where he requested to transfer care to Dr. Para March in October 2022. Given his poor attitude and disrespect towards Arlys John and [place], I don't believe he is a good fit for our clinic.

## 2021-08-17 ENCOUNTER — Ambulatory Visit: Payer: No Typology Code available for payment source | Admitting: Family Medicine

## 2021-08-17 ENCOUNTER — Ambulatory Visit (INDEPENDENT_AMBULATORY_CARE_PROVIDER_SITE_OTHER): Payer: No Typology Code available for payment source | Admitting: Primary Care

## 2021-08-17 ENCOUNTER — Encounter: Payer: Self-pay | Admitting: Primary Care

## 2021-08-17 VITALS — BP 136/82 | HR 82 | Temp 98.6°F | Ht 70.0 in | Wt 208.0 lb

## 2021-08-17 DIAGNOSIS — F411 Generalized anxiety disorder: Secondary | ICD-10-CM

## 2021-08-17 DIAGNOSIS — F331 Major depressive disorder, recurrent, moderate: Secondary | ICD-10-CM | POA: Diagnosis not present

## 2021-08-17 DIAGNOSIS — I10 Essential (primary) hypertension: Secondary | ICD-10-CM | POA: Diagnosis not present

## 2021-08-17 DIAGNOSIS — G8929 Other chronic pain: Secondary | ICD-10-CM

## 2021-08-17 DIAGNOSIS — M545 Low back pain, unspecified: Secondary | ICD-10-CM | POA: Diagnosis not present

## 2021-08-17 DIAGNOSIS — R350 Frequency of micturition: Secondary | ICD-10-CM | POA: Insufficient documentation

## 2021-08-17 MED ORDER — OLMESARTAN MEDOXOMIL 20 MG PO TABS: 20.0000 mg | ORAL_TABLET | Freq: Every day | ORAL | 0 refills | Status: DC

## 2021-08-17 NOTE — Patient Instructions (Addendum)
Stop taking amlodipine for blood pressure.  Start taking olmesartan 20 mg daily for blood pressure.  We will see you back in 2-3 weeks for blood pressure check.  You will be contacted regarding your referral to orthopedics.  Please let us know if you have not been contacted within two weeks.   It was a pleasure to see you today!

## 2021-08-17 NOTE — Assessment & Plan Note (Signed)
Unchanged, but remains bothersome. No alarm signs.  Recommended to update xray today, he declines. Referral placed to orthopedics for further evaluation.

## 2021-08-17 NOTE — Progress Notes (Signed)
Subjective:    Patient ID: Jon English, male    DOB: 09-Aug-1980, 41 y.o.   MRN: [id number]  Hypertension Pertinent negatives include no chest pain, headaches or shortness of breath.    Jon English is a very pleasant 41 y.o. male with a history of hypertension, MDD, GAD, chronic back pain who presents today for follow up of hypertension.   1) Essential Hypertension: Currently managed on amlodipine 10 mg daily. He checks his BP at home which ranges 120's-130's/70's-80's.   He denies chest pain, dizziness, headaches.   He has noticed chronic urinary frequency, especially during the night since starting amlodipine. He is up 4 times nightly most every night. He drinks 7 bottles of water during the day, urinates frequently during the day. He has tried limiting liquids before bed without improvement.   He denies difficulty urinating, dysuria, urgency, hematuria.    BP Readings from Last 3 Encounters:  08/17/21 136/82  10/20/20 118/80  07/21/20 118/62   2) Chronic Back Pain: Chronic to the bilateral lower back without radiculopathy. Overall no improvement throughout the years. Pain is worse with sitting or laying for prolonged periods of time, improved with physical movement. He works in an environment with repetitive lifting and walking, he has no pain when working.    He's been working on stretching. He participated in physical therapy a few years ago without improvement. He underwent plain films of the lumbar spine in 2020 which revealed mild disc space narrowing to the L4-L5 region.   He denies numbness/tingling, radiation of pain down his lower extremities, lower extremity weakness.   Review of Systems  Respiratory:  Negative for shortness of breath.   Cardiovascular:  Negative for chest pain.  Genitourinary:  Positive for frequency. Negative for dysuria, hematuria and urgency.  Musculoskeletal:  Positive for back pain.  Neurological:  Negative for dizziness and  headaches.  Psychiatric/Behavioral:  The patient is not nervous/anxious.          Past Medical History:  Diagnosis Date   Elevated blood pressure reading    Migraines     Social History   Socioeconomic History   Marital status: Married    Spouse name: Not on file   Number of children: Not on file   Years of education: Not on file   Highest education level: Not on file  Occupational History   Not on file  Tobacco Use   Smoking status: Never   Smokeless tobacco: Never  Substance and Sexual Activity   Alcohol use: No   Drug use: No   Sexual activity: Not on file  Other Topics Concern   Not on file  Social History Narrative   Married.   2 children.   Unemployed.   Enjoys exercising.   Social Determinants of Health   Financial Resource Strain: Not on file  Food Insecurity: Not on file  Transportation Needs: Not on file  Physical Activity: Not on file  Stress: Not on file  Social Connections: Not on file  Intimate Partner Violence: Not on file    History reviewed. No pertinent surgical history.  Family History  Problem Relation Age of Onset   Alcohol abuse Mother    Alcohol abuse Father    Diabetes Maternal Grandmother    Hypertension Maternal Grandmother    Diabetes Paternal Grandmother    Hypertension Paternal Grandmother     No Known Allergies  No current outpatient medications on file prior to visit.   No current facility-administered medications on file  prior to visit.    BP 136/82   Pulse 82   Temp 98.6 F (37 C) (Oral)   Ht 5\' 10"  (1.778 m)   Wt 208 lb (94.3 kg)   SpO2 97%   BMI 29.84 kg/m  Objective:   Physical Exam Cardiovascular:     Rate and Rhythm: Normal rate and regular rhythm.  Pulmonary:     Effort: Pulmonary effort is normal.     Breath sounds: Normal breath sounds. No wheezing or rales.  Musculoskeletal:     Cervical back: Neck supple.     Lumbar back: No bony tenderness. Normal range of motion. Negative right straight  leg raise test and negative left straight leg raise test.       Back:  Skin:    General: Skin is warm and dry.  Neurological:     Mental Status: He is alert and oriented to person, place, and time.           Assessment & Plan:   Problem List Items Addressed This Visit       Cardiovascular and Mediastinum   Essential hypertension - Primary    Overall controlled.  Unsure if amlodipine is causing urinary frequency, but given that symptoms began when amlodipine was initiated it's reasonable to switch to another agent. Would avoid HCTZ.  Start olmesartan 20 mg daily.  Stop amlodipine 10 mg daily. We will see him back in 2-3 weeks for BP check and BMP.      Relevant Medications   olmesartan (BENICAR) 20 MG tablet     Other   Moderate episode of recurrent major depressive disorder (HCC)    Denies concerns today, continue to monitor.      GAD (generalized anxiety disorder)    Denies concerns today. Continue to monitor.       Chronic back pain    Unchanged, but remains bothersome. No alarm signs.  Recommended to update xray today, he declines. Referral placed to orthopedics for further evaluation.       Relevant Orders   AMB referral to orthopedics   Urinary frequency    Without other symptoms.  Will discontinue amlodipine to understand if this is the causative agent. We will see him back in a few weeks for follow up.          , NP

## 2021-08-17 NOTE — Assessment & Plan Note (Signed)
Overall controlled.  Unsure if amlodipine is causing urinary frequency, but given that symptoms began when amlodipine was initiated it's reasonable to switch to another agent. Would avoid HCTZ.  Start olmesartan 20 mg daily.  Stop amlodipine 10 mg daily. We will see him back in 2-3 weeks for BP check and BMP.

## 2021-08-17 NOTE — Assessment & Plan Note (Signed)
Denies concerns today. °Continue to monitor. °

## 2021-08-17 NOTE — Assessment & Plan Note (Signed)
Without other symptoms.  Will discontinue amlodipine to understand if this is the causative agent. We will see him back in a few weeks for follow up.

## 2021-08-17 NOTE — Assessment & Plan Note (Signed)
Denies concerns today, continue to monitor.  

## 2021-08-31 ENCOUNTER — Encounter: Payer: Self-pay | Admitting: Primary Care

## 2021-08-31 ENCOUNTER — Ambulatory Visit (INDEPENDENT_AMBULATORY_CARE_PROVIDER_SITE_OTHER): Payer: No Typology Code available for payment source | Admitting: Primary Care

## 2021-08-31 VITALS — BP 134/76 | HR 80 | Temp 98.7°F | Ht 70.0 in | Wt 212.0 lb

## 2021-08-31 DIAGNOSIS — Z1159 Encounter for screening for other viral diseases: Secondary | ICD-10-CM

## 2021-08-31 DIAGNOSIS — Z114 Encounter for screening for human immunodeficiency virus [HIV]: Secondary | ICD-10-CM | POA: Diagnosis not present

## 2021-08-31 DIAGNOSIS — I10 Essential (primary) hypertension: Secondary | ICD-10-CM | POA: Diagnosis not present

## 2021-08-31 DIAGNOSIS — R350 Frequency of micturition: Secondary | ICD-10-CM

## 2021-08-31 NOTE — Patient Instructions (Signed)
Stop by the lab prior to leaving today. I will notify you of your results once received.   Continue olmesartan 20 mg daily for blood pressure.  It was a pleasure to see you today!

## 2021-08-31 NOTE — Assessment & Plan Note (Signed)
Improved. Likely not secondary to amlodipine.  Continue to avoid liquids before bed.

## 2021-08-31 NOTE — Assessment & Plan Note (Signed)
Controlled.  Continue olmesartan 20 mg daily. BMP pending today.

## 2021-08-31 NOTE — Progress Notes (Signed)
Subjective:    Patient ID: Jon English, male    DOB: Jan 17, 1980, 41 y.o.   MRN: [id number]  Hypertension Pertinent negatives include no chest pain, headaches or shortness of breath.    Jon English is a very pleasant 41 y.o. male with a history of hypertension, chronic back pain who presents today for follow-up of hypertension.  He was last evaluated 2 weeks ago for follow-up of hypertension.  During this visit he endorsed urinary frequency at night for which he suspected was secondary to amlodipine. He was drinking a lot of liquids before bed.  Given the symptoms we stopped his amlodipine and initiated olmesartan 20 mg daily.  He is here today for follow-up.  Since his last visit he is compliant to olmesartan daily. Today he endorses no difference in urinary frequency when switching from olmesartan. He did stop drinking water prior to bed and has noticed improvement in urinary frequency.   He did have mild headaches for the first few days of taking olmesartan, but these have abated. He denies chest pain, dizziness, headaches now.   BP Readings from Last 3 Encounters:  08/31/21 134/76  08/17/21 136/82  10/20/20 118/80      Review of Systems  Respiratory:  Negative for shortness of breath.   Cardiovascular:  Negative for chest pain.  Neurological:  Negative for dizziness and headaches.         Past Medical History:  Diagnosis Date   Elevated blood pressure reading    Migraines     Social History   Socioeconomic History   Marital status: Married    Spouse name: Not on file   Number of children: Not on file   Years of education: Not on file   Highest education level: Not on file  Occupational History   Not on file  Tobacco Use   Smoking status: Never   Smokeless tobacco: Never  Substance and Sexual Activity   Alcohol use: No   Drug use: No   Sexual activity: Not on file  Other Topics Concern   Not on file  Social History Narrative   Married.    2 children.   Unemployed.   Enjoys exercising.   Social Determinants of Health   Financial Resource Strain: Not on file  Food Insecurity: Not on file  Transportation Needs: Not on file  Physical Activity: Not on file  Stress: Not on file  Social Connections: Not on file  Intimate Partner Violence: Not on file    History reviewed. No pertinent surgical history.  Family History  Problem Relation Age of Onset   Alcohol abuse Mother    Alcohol abuse Father    Diabetes Maternal Grandmother    Hypertension Maternal Grandmother    Diabetes Paternal Grandmother    Hypertension Paternal Grandmother     No Known Allergies  Current Outpatient Medications on File Prior to Visit  Medication Sig Dispense Refill   olmesartan (BENICAR) 20 MG tablet Take 1 tablet (20 mg total) by mouth daily. for blood pressure. 30 tablet 0   No current facility-administered medications on file prior to visit.    BP 134/76   Pulse 80   Temp 98.7 F (37.1 C) (Oral)   Ht 5\' 10"  (1.778 m)   Wt 212 lb (96.2 kg)   SpO2 98%   BMI 30.42 kg/m  Objective:   Physical Exam Cardiovascular:     Rate and Rhythm: Normal rate and regular rhythm.  Pulmonary:     Effort: Pulmonary  effort is normal.     Breath sounds: Normal breath sounds. No wheezing or rales.  Musculoskeletal:     Cervical back: Neck supple.  Skin:    General: Skin is warm and dry.  Neurological:     Mental Status: He is alert and oriented to person, place, and time.           Assessment & Plan:   Problem List Items Addressed This Visit       Cardiovascular and Mediastinum   Essential hypertension - Primary    Controlled.  Continue olmesartan 20 mg daily. BMP pending today.      Relevant Orders   Comprehensive metabolic panel   Lipid panel     Other   Urinary frequency    Improved. Likely not secondary to amlodipine.  Continue to avoid liquids before bed.      Other Visit Diagnoses     Screening for HIV  (human immunodeficiency virus)       Relevant Orders   HIV antibody (with reflex)   Encounter for hepatitis C screening test for low risk patient       Relevant Orders   Hepatitis C Antibody          Doreene Nest, NP

## 2021-09-02 ENCOUNTER — Other Ambulatory Visit: Payer: Self-pay | Admitting: Primary Care

## 2021-09-02 DIAGNOSIS — I10 Essential (primary) hypertension: Secondary | ICD-10-CM

## 2021-09-02 MED ORDER — OLMESARTAN MEDOXOMIL 20 MG PO TABS: 20.0000 mg | ORAL_TABLET | Freq: Every day | ORAL | 3 refills | Status: AC

## 2021-09-03 ENCOUNTER — Other Ambulatory Visit: Payer: Self-pay | Admitting: Primary Care

## 2021-09-03 ENCOUNTER — Encounter: Payer: Self-pay | Admitting: *Deleted

## 2021-09-03 DIAGNOSIS — N289 Disorder of kidney and ureter, unspecified: Secondary | ICD-10-CM

## 2021-09-03 LAB — COMPREHENSIVE METABOLIC PANEL
AG Ratio: 1.8 (calc) (ref 1.0–2.5)
ALT: 19 U/L (ref 9–46)
AST: 22 U/L (ref 10–40)
Albumin: 4.4 g/dL (ref 3.6–5.1)
Alkaline phosphatase (APISO): 56 U/L (ref 36–130)
BUN/Creatinine Ratio: 12 (calc) (ref 6–22)
BUN: 16 mg/dL (ref 7–25)
CO2: 27 mmol/L (ref 20–32)
Calcium: 9 mg/dL (ref 8.6–10.3)
Chloride: 104 mmol/L (ref 98–110)
Creat: 1.37 mg/dL — ABNORMAL HIGH (ref 0.60–1.29)
Globulin: 2.5 g/dL (calc) (ref 1.9–3.7)
Glucose, Bld: 92 mg/dL (ref 65–99)
Potassium: 3.7 mmol/L (ref 3.5–5.3)
Sodium: 141 mmol/L (ref 135–146)
Total Bilirubin: 1.4 mg/dL — ABNORMAL HIGH (ref 0.2–1.2)
Total Protein: 6.9 g/dL (ref 6.1–8.1)

## 2021-09-03 LAB — HIV ANTIBODY (ROUTINE TESTING W REFLEX): HIV 1&2 Ab, 4th Generation: NONREACTIVE

## 2021-09-03 LAB — EXTRA LAV TOP TUBE

## 2021-09-03 LAB — LIPID PANEL
Cholesterol: 177 mg/dL (ref <200)
HDL: 44 mg/dL (ref >=40)
LDL Cholesterol (Calc): 111 mg/dL (calc) — ABNORMAL HIGH
Non-HDL Cholesterol (Calc): 133 mg/dL (calc) — ABNORMAL HIGH (ref <130)
Total CHOL/HDL Ratio: 4 (calc) (ref <5.0)
Triglycerides: 116 mg/dL (ref <150)

## 2021-09-03 LAB — HEPATITIS C ANTIBODY: Hepatitis C Ab: NONREACTIVE

## 2021-09-28 ENCOUNTER — Ambulatory Visit (INDEPENDENT_AMBULATORY_CARE_PROVIDER_SITE_OTHER): Payer: No Typology Code available for payment source | Admitting: Orthopaedic Surgery

## 2021-09-28 ENCOUNTER — Encounter: Payer: Self-pay | Admitting: Orthopaedic Surgery

## 2021-09-28 VITALS — BP 145/86 | HR 69 | Ht 71.0 in | Wt 207.0 lb

## 2021-09-28 DIAGNOSIS — G8929 Other chronic pain: Secondary | ICD-10-CM

## 2021-09-28 DIAGNOSIS — M79606 Pain in leg, unspecified: Secondary | ICD-10-CM

## 2021-09-28 DIAGNOSIS — M545 Low back pain, unspecified: Secondary | ICD-10-CM

## 2021-09-28 NOTE — Progress Notes (Unsigned)
   Office Visit Note   Patient: Jon English           Date of Birth: 1980-08-18           MRN: [id number] Visit Date: 09/28/2021              Requested by: Doreene Nest, NP [address] [place],  [place] [address] PCP: Doreene Nest, NP   Assessment & Plan: Visit Diagnoses: No diagnosis found.  Plan: ***  Follow-Up Instructions: No follow-ups on file.   Orders:  No orders of the defined types were placed in this encounter.  No orders of the defined types were placed in this encounter.     Procedures: No procedures performed   Clinical Data: No additional findings.   Subjective: Chief Complaint  Patient presents with   Lower Back - Pain    HPI  Review of Systems   Objective: Vital Signs: BP (!) 145/86   Pulse 69   Ht 5\' 11"  (1.803 m)   Wt 207 lb (93.9 kg)   BMI 28.87 kg/m   Physical Exam  Ortho Exam  Specialty Comments:  No specialty comments available.  Imaging: No results found.   PMFS History: Patient Active Problem List   Diagnosis Date Noted   Urinary frequency 08/17/2021   Right shoulder pain 10/22/2020   Leg pain 06/07/2020   Essential hypertension 10/16/2017   Chronic back pain 10/16/2017   Moderate episode of recurrent major depressive disorder (HCC) 01/02/2017   GAD (generalized anxiety disorder) 01/02/2017   Past Medical History:  Diagnosis Date   Elevated blood pressure reading    Migraines     Family History  Problem Relation Age of Onset   Alcohol abuse Mother    Alcohol abuse Father    Diabetes Maternal Grandmother    Hypertension Maternal Grandmother    Diabetes Paternal Grandmother    Hypertension Paternal Grandmother     No past surgical history on file. Social History   Occupational History   Not on file  Tobacco Use   Smoking status: Never   Smokeless tobacco: Never  Substance and Sexual Activity   Alcohol use: No   Drug use: No   Sexual activity: Not on file

## 2021-10-17 NOTE — Telephone Encounter (Signed)
Error

## 2023-01-30 ENCOUNTER — Ambulatory Visit: Payer: No Typology Code available for payment source | Admitting: Primary Care
